# Patient Record
Sex: Female | Born: 1960 | Race: White | Hispanic: No | Marital: Married | Smoking: Never smoker
Health system: Southern US, Community
[De-identification: ages and names within clinical notes are randomized; demographics above are authoritative.]

## PROBLEM LIST (undated history)

## (undated) DIAGNOSIS — F419 Anxiety disorder, unspecified: Secondary | ICD-10-CM

## (undated) DIAGNOSIS — F32A Depression, unspecified: Secondary | ICD-10-CM

## (undated) DIAGNOSIS — H4902 Third [oculomotor] nerve palsy, left eye: Principal | ICD-10-CM

## (undated) DIAGNOSIS — G2581 Restless legs syndrome: Secondary | ICD-10-CM

## (undated) DIAGNOSIS — E039 Hypothyroidism, unspecified: Secondary | ICD-10-CM

## (undated) DIAGNOSIS — F329 Major depressive disorder, single episode, unspecified: Secondary | ICD-10-CM

## (undated) DIAGNOSIS — E119 Type 2 diabetes mellitus without complications: Secondary | ICD-10-CM

## (undated) DIAGNOSIS — E7800 Pure hypercholesterolemia, unspecified: Secondary | ICD-10-CM

## (undated) DIAGNOSIS — D279 Benign neoplasm of unspecified ovary: Secondary | ICD-10-CM

## (undated) HISTORY — DX: Third (oculomotor) nerve palsy, left eye: H49.02

## (undated) HISTORY — DX: Depression, unspecified: F32.A

## (undated) HISTORY — PX: OVARIAN CYST SURGERY: SHX726

## (undated) HISTORY — PX: KNEE ARTHROSCOPY: SUR90

## (undated) HISTORY — DX: Major depressive disorder, single episode, unspecified: F32.9

## (undated) HISTORY — DX: Benign neoplasm of unspecified ovary: D27.9

## (undated) HISTORY — DX: Hypothyroidism, unspecified: E03.9

## (undated) HISTORY — DX: Anxiety disorder, unspecified: F41.9

## (undated) HISTORY — DX: Pure hypercholesterolemia, unspecified: E78.00

## (undated) HISTORY — PX: SALPINGOOPHORECTOMY: SHX82

## (undated) HISTORY — DX: Restless legs syndrome: G25.81

---

## 2016-05-06 DIAGNOSIS — K529 Noninfective gastroenteritis and colitis, unspecified: Secondary | ICD-10-CM | POA: Diagnosis not present

## 2016-05-06 DIAGNOSIS — R112 Nausea with vomiting, unspecified: Secondary | ICD-10-CM | POA: Diagnosis not present

## 2017-02-28 DIAGNOSIS — E039 Hypothyroidism, unspecified: Secondary | ICD-10-CM | POA: Diagnosis not present

## 2017-02-28 DIAGNOSIS — R51 Headache: Secondary | ICD-10-CM | POA: Diagnosis not present

## 2017-02-28 DIAGNOSIS — Z8669 Personal history of other diseases of the nervous system and sense organs: Secondary | ICD-10-CM | POA: Diagnosis not present

## 2017-02-28 DIAGNOSIS — H53142 Visual discomfort, left eye: Secondary | ICD-10-CM | POA: Diagnosis not present

## 2017-02-28 DIAGNOSIS — E785 Hyperlipidemia, unspecified: Secondary | ICD-10-CM | POA: Diagnosis not present

## 2017-02-28 DIAGNOSIS — E119 Type 2 diabetes mellitus without complications: Secondary | ICD-10-CM | POA: Diagnosis not present

## 2017-02-28 DIAGNOSIS — H4902 Third [oculomotor] nerve palsy, left eye: Secondary | ICD-10-CM | POA: Diagnosis not present

## 2017-02-28 DIAGNOSIS — R110 Nausea: Secondary | ICD-10-CM | POA: Diagnosis not present

## 2017-02-28 DIAGNOSIS — F329 Major depressive disorder, single episode, unspecified: Secondary | ICD-10-CM | POA: Diagnosis not present

## 2017-03-01 DIAGNOSIS — R51 Headache: Secondary | ICD-10-CM | POA: Diagnosis not present

## 2017-03-01 DIAGNOSIS — H53142 Visual discomfort, left eye: Secondary | ICD-10-CM | POA: Diagnosis not present

## 2017-03-01 DIAGNOSIS — H4902 Third [oculomotor] nerve palsy, left eye: Secondary | ICD-10-CM | POA: Diagnosis not present

## 2017-03-01 DIAGNOSIS — R110 Nausea: Secondary | ICD-10-CM | POA: Diagnosis not present

## 2017-03-06 ENCOUNTER — Emergency Department (HOSPITAL_COMMUNITY)
Admission: EM | Admit: 2017-03-06 | Discharge: 2017-03-06 | Disposition: A | Discharge status: Home / Self Care | Payer: BC Managed Care – PPO | Attending: Emergency Medicine | Admitting: Emergency Medicine

## 2017-03-06 ENCOUNTER — Emergency Department (HOSPITAL_COMMUNITY): Payer: BC Managed Care – PPO

## 2017-03-06 ENCOUNTER — Encounter (HOSPITAL_COMMUNITY): Payer: Self-pay

## 2017-03-06 DIAGNOSIS — Z794 Long term (current) use of insulin: Secondary | ICD-10-CM | POA: Diagnosis not present

## 2017-03-06 DIAGNOSIS — R51 Headache: Secondary | ICD-10-CM | POA: Insufficient documentation

## 2017-03-06 DIAGNOSIS — E119 Type 2 diabetes mellitus without complications: Secondary | ICD-10-CM | POA: Insufficient documentation

## 2017-03-06 DIAGNOSIS — H4902 Third [oculomotor] nerve palsy, left eye: Secondary | ICD-10-CM

## 2017-03-06 DIAGNOSIS — R112 Nausea with vomiting, unspecified: Secondary | ICD-10-CM | POA: Diagnosis present

## 2017-03-06 HISTORY — DX: Type 2 diabetes mellitus without complications: E11.9

## 2017-03-06 LAB — COMPREHENSIVE METABOLIC PANEL
ALBUMIN: 4.3 g/dL (ref 3.5–5.0)
ALT: 25 U/L (ref 14–54)
AST: 24 U/L (ref 15–41)
Alkaline Phosphatase: 57 U/L (ref 38–126)
Anion gap: 13 (ref 5–15)
BUN: 15 mg/dL (ref 6–20)
CHLORIDE: 100 mmol/L — AB (ref 101–111)
CO2: 26 mmol/L (ref 22–32)
Calcium: 9.5 mg/dL (ref 8.9–10.3)
Creatinine, Ser: 0.88 mg/dL (ref 0.44–1.00)
GFR calc Af Amer: >60 mL/min (ref >60)
GFR calc non Af Amer: >60 mL/min (ref >60)
Glucose, Bld: 169 mg/dL — ABNORMAL HIGH (ref 65–99)
POTASSIUM: 3.3 mmol/L — AB (ref 3.5–5.1)
SODIUM: 139 mmol/L (ref 135–145)
Total Bilirubin: 0.5 mg/dL (ref 0.3–1.2)
Total Protein: 7.1 g/dL (ref 6.5–8.1)

## 2017-03-06 LAB — CBC WITH DIFFERENTIAL/PLATELET
BASOS ABS: 0 10*3/uL (ref 0.0–0.1)
BASOS PCT: 0 %
EOS ABS: 0.1 10*3/uL (ref 0.0–0.7)
EOS PCT: 1 %
HCT: 41.9 % (ref 36.0–46.0)
Hemoglobin: 13.9 g/dL (ref 12.0–15.0)
Lymphocytes Relative: 29 %
Lymphs Abs: 2.4 10*3/uL (ref 0.7–4.0)
MCH: 30 pg (ref 26.0–34.0)
MCHC: 33.2 g/dL (ref 30.0–36.0)
MCV: 90.5 fL (ref 78.0–100.0)
MONO ABS: 0.4 10*3/uL (ref 0.1–1.0)
Monocytes Relative: 5 %
Neutro Abs: 5.4 10*3/uL (ref 1.7–7.7)
Neutrophils Relative %: 65 %
PLATELETS: 232 10*3/uL (ref 150–400)
RBC: 4.63 MIL/uL (ref 3.87–5.11)
RDW: 13 % (ref 11.5–15.5)
WBC: 8.3 10*3/uL (ref 4.0–10.5)

## 2017-03-06 LAB — CBG MONITORING, ED: GLUCOSE-CAPILLARY: 159 mg/dL — AB (ref 65–99)

## 2017-03-06 MED ORDER — SODIUM CHLORIDE 0.9 % IV BOLUS (SEPSIS)
1000.0000 mL | Freq: Once | INTRAVENOUS | Status: AC
  Administered 2017-03-06: 1000 mL via INTRAVENOUS

## 2017-03-06 MED ORDER — PROCHLORPERAZINE EDISYLATE 5 MG/ML IJ SOLN
10.0000 mg | Freq: Once | INTRAMUSCULAR | Status: AC
  Administered 2017-03-06: 10 mg via INTRAVENOUS
  Filled 2017-03-06: qty 2

## 2017-03-06 MED ORDER — ONDANSETRON HCL 4 MG/2ML IJ SOLN
4.0000 mg | Freq: Once | INTRAMUSCULAR | Status: AC
  Administered 2017-03-06: 4 mg via INTRAVENOUS
  Filled 2017-03-06: qty 2

## 2017-03-06 MED ORDER — GADOBENATE DIMEGLUMINE 529 MG/ML IV SOLN
14.0000 mL | Freq: Once | INTRAVENOUS | Status: AC | PRN
  Administered 2017-03-06: 14 mL via INTRAVENOUS

## 2017-03-06 MED ORDER — ONDANSETRON 4 MG PO TBDP
ORAL_TABLET | ORAL | Status: AC
  Filled 2017-03-06: qty 1

## 2017-03-06 MED ORDER — DIAZEPAM 5 MG/ML IJ SOLN
5.0000 mg | Freq: Once | INTRAMUSCULAR | Status: AC
  Administered 2017-03-06: 5 mg via INTRAVENOUS
  Filled 2017-03-06: qty 2

## 2017-03-06 MED ORDER — ONDANSETRON 4 MG PO TBDP
4.0000 mg | ORAL_TABLET | Freq: Once | ORAL | Status: AC
  Administered 2017-03-06: 4 mg via ORAL

## 2017-03-06 MED ORDER — HYDROMORPHONE HCL 1 MG/ML IJ SOLN
1.0000 mg | Freq: Once | INTRAMUSCULAR | Status: AC
  Administered 2017-03-06: 1 mg via INTRAVENOUS
  Filled 2017-03-06: qty 1

## 2017-03-06 NOTE — ED Notes (Signed)
Pt still in MRI 

## 2017-03-06 NOTE — ED Notes (Signed)
Consulted with main lab. Per main lab, burgdorfri antibody test to be drawn in gold top tube.

## 2017-03-06 NOTE — ED Notes (Signed)
Pt still in MRI at this time, per MRI pt's scan will be finished in approx 45 mins. This nurse updated family, pt husband given another sprite and graham crackers.

## 2017-03-06 NOTE — ED Provider Notes (Signed)
[facility] DEPT Provider Note   CSN: [id number] Arrival date & time: 03/06/17  1012     History   Chief Complaint Chief Complaint  Patient presents with  . Emesis  . Headache    HPI Heather Guerrero is a 56 y.o. female.  Patient complains of headache and nausea. This been going on for 10 days she was seen at [facility] and was evaluated and told if she got worse to come to this hospital where there is neurologist. She was diagnosed with a third nerve palsy.   The history is provided by the patient. No language interpreter was used.  Emesis   This is a recurrent problem. The problem occurs 2 to 4 times per day. The problem has not changed since onset.The emesis has an appearance of stomach contents. There has been no fever. Associated symptoms include headaches. Pertinent negatives include no abdominal pain, no chills, no cough and no diarrhea.  Headache   This is a recurrent problem. The current episode started more than 1 week ago. The problem occurs constantly. The problem has not changed since onset.The headache is associated with nothing. The pain is located in the left unilateral region. The quality of the pain is described as dull. The pain is at a severity of 6/10. Associated symptoms include vomiting.    Past Medical History:  Diagnosis Date  . Diabetes mellitus without complication ([place])     There are no active problems to display for this patient.   Past Surgical History:  Procedure Laterality Date  . KNEE ARTHROSCOPY     right    OB History    No data available       Home Medications    Prior to Admission medications   Not on File    Family History No family history on file.  Social History Social History  Substance Use Topics  . Smoking status: Never Smoker  . Smokeless tobacco: Never Used  . Alcohol use No     Allergies   Sulfa antibiotics   Review of Systems Review of Systems  Constitutional: Negative for appetite  change, chills and fatigue.  HENT: Negative for congestion, ear discharge and sinus pressure.        Patient seems to have inability to move her left thigh to the right  Eyes: Negative for discharge.  Respiratory: Negative for cough.   Cardiovascular: Negative for chest pain.  Gastrointestinal: Positive for vomiting. Negative for abdominal pain and diarrhea.  Genitourinary: Negative for frequency and hematuria.  Musculoskeletal: Negative for back pain.  Skin: Negative for rash.  Neurological: Positive for headaches. Negative for seizures.  Psychiatric/Behavioral: Negative for hallucinations.     Physical Exam Updated Vital Signs BP 121/81   Pulse 85   Temp 98.2 F (36.8 C) (Oral)   Resp 16   Ht 5\' 3"  (1.6 m)   Wt 65.3 kg (144 lb)   SpO2 100%   BMI 25.51 kg/m   Physical Exam  Constitutional: She is oriented to person, place, and time. She appears well-developed.  HENT:  Head: Normocephalic.  Patient had a palsy in her left eye moving it to the right.  Eyes: Conjunctivae and EOM are normal. No scleral icterus.  Neck: Neck supple. No thyromegaly present.  Cardiovascular: Normal rate and regular rhythm.  Exam reveals no gallop and no friction rub.   No murmur heard. Pulmonary/Chest: No stridor. She has no wheezes. She has no rales. She exhibits no tenderness.  Abdominal: She exhibits no  distension. There is no tenderness. There is no rebound.  Musculoskeletal: Normal range of motion. She exhibits no edema.  Lymphadenopathy:    She has no cervical adenopathy.  Neurological: She is oriented to person, place, and time. She exhibits normal muscle tone. Coordination normal.  Skin: No rash noted. No erythema.  Psychiatric: She has a normal mood and affect. Her behavior is normal.     ED Treatments / Results  Labs (all labs ordered are listed, but only abnormal results are displayed) Labs Reviewed  COMPREHENSIVE METABOLIC PANEL - Abnormal; Notable for the following:        Result Value   Potassium 3.3 (*)    Chloride 100 (*)    Glucose, Bld 169 (*)    All other components within normal limits  CBG MONITORING, ED - Abnormal; Notable for the following:    Glucose-Capillary 159 (*)    All other components within normal limits  CBC WITH DIFFERENTIAL/PLATELET  CBG MONITORING, ED    EKG  EKG Interpretation None       Radiology No results found.  Procedures Procedures (including critical care time)  Medications Ordered in ED Medications  ondansetron (ZOFRAN-ODT) disintegrating tablet 4 mg (4 mg Oral Given 03/06/17 1031)  sodium chloride 0.9 % bolus 1,000 mL (0 mLs Intravenous Stopped 03/06/17 1508)  HYDROmorphone (DILAUDID) injection 1 mg (1 mg Intravenous Given 03/06/17 1317)  ondansetron (ZOFRAN) injection 4 mg (4 mg Intravenous Given 03/06/17 1317)  ondansetron (ZOFRAN) injection 4 mg (4 mg Intravenous Given 03/06/17 1515)  prochlorperazine (COMPAZINE) injection 10 mg (10 mg Intravenous Given 03/06/17 1541)     Initial Impression / Assessment and Plan / ED Course  I have reviewed the triage vital signs and the nursing notes.  Pertinent labs & imaging results that were available during my care of the patient were reviewed by me and considered in my medical decision making (see chart for details).     Patient with what appears to be paralysis in her left eye. She is being seen by neurology  Final Clinical Impressions(s) / ED Diagnoses   Final diagnoses:  Third nerve palsy of left eye    New Prescriptions New Prescriptions   No medications on file     Milton Ferguson, MD 03/06/17 [phone]

## 2017-03-06 NOTE — ED Notes (Signed)
Pt was still experiencing nausea after zofran administration, given 10 mg ordered compazine by this nurse in MRI.

## 2017-03-06 NOTE — ED Triage Notes (Signed)
Pt arrives with complaints of pressure behind left eye, headache, and vomiting. Pt was dx with trigeminal neuralgia and has been taking medications prescribed (fioricet and prednisone) with no relief. This morning pt began vomiting with pain. Pt has DM with insulin pump on at this time. Pt reports having multiple ct scans and an mri Saturday for same problem.

## 2017-03-06 NOTE — ED Notes (Signed)
Pt CBG was 159, notified Chelsea(RN)

## 2017-03-06 NOTE — Consult Note (Signed)
NEURO HOSPITALIST CONSULT NOTE   Requesting physician: Dr. Roderic Palau  Reason for Consult: Headache  History obtained from:  Patient and Chart    HPI:                                                                                                                                          Heather Guerrero is an 56 y.o. female who presents to the [facility] Emergency department this morning with a 10 day history of sharp temporal pain, a left-frontal headache, inability to adduct the left eye and vomiting.  She presented to [facility] five days ago with this problem where she was diagnosed with a third nerve palsy. MRI performed there was unremarkable; she was prescribed Fioricet and prednisone and sent to an opthalmologist for follow up. Heather Guerrero stated that there has been no change in the quality of her headache or ability to move her eye since she woke up on [id number].  On arrival to [facility], she stated her headache was a 9/10; following zofran, compazine and dilauded, she stated it was 2/10 until the lights were turned on for the neuro exam. On our visit, she was laying in bed, visibly uncomfortable with her left eye shut unless requested to open it. She notes that she has had blurry vision since [id number], and keeping the left eye closed occasionally helps to relieve this.  [name] has a history of type one diabetes (diagnosed at age 60) and carries a diagnosis of trigeminal neuralgia (clinically unlikely). She is otherwise healthy.  Husband present at bedside  Past Medical History:  Diagnosis Date  . Diabetes mellitus without complication [facility])     Past Surgical History:  Procedure Laterality Date  . KNEE ARTHROSCOPY     right    No family history on file.  Social History:  reports that she has never smoked. She has never used smokeless tobacco. She reports that she does not drink alcohol or use drugs.  Allergies  Allergen Reactions  . Sulfa Antibiotics      MEDICATIONS:                                                                                                                   No outpatient prescriptions have been marked as taking for  the 03/06/17 encounter [facility] Encounter).     Review Of Systems:                                                                                                           History obtained from the patient  General: Positive fatigue - prednisone causes her insomnia Psychological: Negative for any known behavioral disorder Ophthalmic: As per HPI ENT: Negative for abrupt loss of hearing or vertigo Respiratory: Negative for cough, hemoptysis, shortness of breath or wheezing Cardiovascular: Negative for chest pain, dyspnea on exertion, edema or irregular heartbeat Gastrointestinal: Positive for nausea/vomiting Genito-Urinary: Negative for dysuria, hematuria, incontinence Musculoskeletal: Negative for joint swelling or muscular weakness Neurological: As noted in HPI Dermatological: Negative for rash or skin changes  Blood pressure 121/81, pulse 85, temperature 98.2 F (36.8 C), temperature source Oral, resp. rate 16, height 5\' 3"  (1.6 m), weight 65.3 kg (144 lb), SpO2 100 %.   Physical Examination:                                                                                                      General: WDWN female. As per HPI. HEENT:  Normocephalic, no lesions, without obvious abnormality.  Normal external eye and conjunctiva.  Normal external ears. Normal external nose, mucus membranes and septum.  Normal pharynx. Cardiovascular: S1, S2 normal, pulses palpable throughout   Pulmonary: chest clear, no wheezing, rales, normal symmetric air entry Abdomen: Soft, non-tender Extremities: no joint deformities, effusion, or inflammation Musculoskeletal: no joint tenderness, deformity or swelling Tone and bulk:normal tone throughout; no atrophy noted Skin: warm and dry, no hyperpigmentation, vitiligo, or  suspicious lesions  Neurological Examination:                                                                                               Mental Status: Heather Guerrero is alert, oriented x 4, thought content appropriate.  Speech fluent without evidence of aphasia. Able to follow 3-step commands without difficulty. Cranial Nerves: II: Visual fields grossly normal, pupils are reactive to light bilaterally, with subtle asymmetry, left being approximately 0.5 mm larger than right. III,IV, VI: Ptosis not present, extra-ocular muscle movements fully intact on the right. Left eye with inability to adduct and moderate weakness with decreased excursion  on both upward and downward gaze. No nystagmus noted. V,VII: Smile and eyebrow raise is symmetric. Facial light touch sensation intact bilaterally VIII: Hearing grossly intact IX,X: Uvula and palate rise symmetrically XI: SCM and bilateral shoulder shrug strength 5/5 XII: Midline tongue extension Motor: Right :     Upper extremity   5/5   Left:     Upper extremity   5/5          Lower extremity   5/5     Lower extremity   5/5 Pronator drift not present Sensory: Light touch intact throughout, bilaterally Deep Tendon Reflexes: 2+ and symmetric throughout Plantars: Right: downgoing   Left: downgoing Cerebellar: Finger-to-nose test without evidence of dysmetria or ataxia.  Gait: Normal gait and station   Lab Results: Basic Metabolic Panel:  Recent Labs Lab 03/06/17 1037  NA 139  K 3.3*  CL 100*  CO2 26  GLUCOSE 169*  BUN 15  CREATININE 0.88  CALCIUM 9.5    Liver Function Tests:  Recent Labs Lab 03/06/17 1037  AST 24  ALT 25  ALKPHOS 57  BILITOT 0.5  PROT 7.1  ALBUMIN 4.3   No results for input(s): LIPASE, AMYLASE in the last 168 hours. No results for input(s): AMMONIA in the last 168 hours.  CBC:  Recent Labs Lab 03/06/17 1037  WBC 8.3  NEUTROABS 5.4  HGB 13.9  HCT 41.9  MCV 90.5  PLT 232    Cardiac Enzymes: No  results for input(s): CKTOTAL, CKMB, CKMBINDEX, TROPONINI in the last 168 hours.  Lipid Panel: No results for input(s): CHOL, TRIG, HDL, CHOLHDL, VLDL, LDLCALC in the last 168 hours.  CBG:  Recent Labs Lab 03/06/17 [place]    Microbiology: No results found for this or any previous visit.  Coagulation Studies: No results for input(s): LABPROT, INR in the last 72 hours.  Imaging: No results found.  History and examination documented by Solon Augusta PA-C, [facility] 03/06/2017, 4:30 PM  Assessment and Plan: 56 year old female with left CN III palsy 1. 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 Also possible would be nerve root inflammation or infectious etiology such as Lyme disease. Compression of CN III by extrinsic mass or aneurysm unlikely given relative sparing of the pupil. Not consistent with INO given vertical gaze deficit. Prior MRI at OSH negative for lesion, but was performed without contrast.  2. Type I diabetes. Diagnosed at age 22. Has insulin pump.   Recommendations: 1. Serum Lyme titer 2. MRI brain with and without contrast, with thin slices through brainstem 3. MRI orbits with and without contrast.  4. MRA head to rule out aneurysm.   Electronically signed: Dr. Kerney Elbe

## 2017-03-06 NOTE — ED Notes (Signed)
Per MRI, pt states she is "tense." Will speak with Dr. Wilson Singer.

## 2017-03-06 NOTE — ED Notes (Signed)
Pt transported to MRI 

## 2017-03-06 NOTE — ED Notes (Signed)
Pt sill in MRI, this nurse introduced self to pt family. Pt husband brought sprite and ice water.

## 2017-03-10 LAB — B. BURGDORFI ANTIBODIES: B burgdorferi Ab IgG+IgM: <0.91 {ISR} (ref 0.00–0.90)

## 2017-03-11 ENCOUNTER — Encounter: Payer: Self-pay | Admitting: Neurology

## 2017-03-11 ENCOUNTER — Ambulatory Visit (INDEPENDENT_AMBULATORY_CARE_PROVIDER_SITE_OTHER): Payer: BC Managed Care – PPO | Admitting: Neurology

## 2017-03-11 DIAGNOSIS — H4902 Third [oculomotor] nerve palsy, left eye: Secondary | ICD-10-CM | POA: Diagnosis not present

## 2017-03-11 HISTORY — DX: Third (oculomotor) nerve palsy, left eye: H49.02

## 2017-03-11 MED ORDER — OXYCODONE HCL 10 MG PO TABS: 10.0000 mg | ORAL_TABLET | Freq: Four times a day (QID) | ORAL | 0 refills | Status: DC | PRN

## 2017-03-11 MED ORDER — GABAPENTIN 300 MG PO CAPS: 300.0000 mg | ORAL_CAPSULE | Freq: Three times a day (TID) | ORAL | 3 refills | Status: DC

## 2017-03-11 NOTE — Patient Instructions (Signed)
   We will start gabapentin for the headache.  Neurontin (gabapentin) may result in drowsiness, ankle swelling, gait instability, or possibly dizziness. Please contact our office if significant side effects occur with this medication.  

## 2017-03-11 NOTE — Progress Notes (Signed)
Reason for visit: Double vision  Referring physician: Dr. Angus Palms Heather Guerrero is a 56 y.o. female  History of present illness:  [name] is a 56 year old left-handed white female with a history of diabetes with a hemoglobin A1c of 7.6. The patient began noting onset of some left temporal discomfort on 02/25/2020. By May 25, she had overt double vision with severe pain on the left temporal area and into the orbit as well. The patient went to [facility], she underwent a CT of the brain that was unremarkable, CT angiogram was unremarkable and MRI of the brain was done that was normal. The patient had blood work that was done including a B12 level, sedimentation rate, C-reactive protein, TSH, and RPR that were normal. The patient was discharged to home with the diagnosis of a diabetic third nerve palsy and she was given medications for pain. The patient went to [facility] on 03/06/2017 because of ongoing pain. A MRI of the brain was repeated which was normal, MRA of the head and neck was done and was unremarkable with exception of a questionable 2 mm outpouching of the takeoff of the right posterior cerebral artery. It was not definite that this represented an aneurysm. The MRV study was normal. The patient has had ongoing pain which has been her main disability. She continues to see double, she has not had ptosis of the left eye. She reports no numbness or weakness of the face, arms, or legs. She denies any balance changes or difficulty controlling the bowels or the bladder. She has not had any change in speech or swallowing. The patient has been out of work because of the above problem.  Past Medical History:  Diagnosis Date  . Depression   . Diabetes mellitus without complication ([place])   . Hypercholesteremia     Past Surgical History:  Procedure Laterality Date  . KNEE ARTHROSCOPY     right  . OVARIAN CYST SURGERY    . SALPINGOOPHORECTOMY      History reviewed. No  pertinent family history.  Social history:  reports that she has never smoked. She has never used smokeless tobacco. She reports that she does not drink alcohol or use drugs.  Medications:  Prior to Admission medications   Medication Sig Start Date End Date Taking? Authorizing Provider  atorvastatin (LIPITOR) 10 MG tablet Take 10 mg by mouth daily.   Yes [provider]  butalbital-acetaminophen-caffeine (FIORICET, ESGIC) 50-325-40 MG tablet Take 1 tablet by mouth every 4 (four) hours as needed. 03/01/17  Yes [provider]  cetirizine (ZYRTEC) 10 MG tablet Take 10 mg by mouth daily.   Yes [provider]  Cholecalciferol (VITAMIN D3) 1000 units CAPS Take 1,000 Units by mouth daily.   Yes [provider]  insulin aspart (NOVOLOG) 100 UNIT/ML injection Inject into the skin 3 (three) times daily before meals. Pump   Yes [provider]  levothyroxine (SYNTHROID, LEVOTHROID) 50 MCG tablet Take 50 mcg by mouth daily before breakfast.   Yes [provider]  lisinopril (PRINIVIL,ZESTRIL) 2.5 MG tablet Take 2.5 mg by mouth daily.   Yes [provider]  Multiple Vitamin (MULTIVITAMIN) tablet Take 1 tablet by mouth daily.   Yes [provider]  Oxycodone HCl 10 MG TABS Take 10 mg by mouth every 6 (six) hours as needed. 03/10/17  Yes [provider]  predniSONE (DELTASONE) 10 MG tablet Take 10 mg by mouth daily with breakfast. taper   Yes  [provider]  sertraline (ZOLOFT) 100 MG tablet Take 100 mg by mouth daily.   Yes [provider]      Allergies  Allergen Reactions  . Sulfa Antibiotics     ROS:  Out of a complete 14 system review of symptoms, the patient complains only of the following symptoms, and all other reviewed systems are negative.  Fatigue Ringing in the ears Double vision, eye pain Allergies Headache, dizziness Decreased energy, change in appetite Restless legs  Blood pressure  120/78, pulse 88, height 5\' 3"  (1.6 m), weight 141 lb (64 kg).  Physical Exam  General: The patient is alert and cooperative at the time of the examination.  Eyes: Pupils are equal, round, and reactive to light. Discs are flat bilaterally.  Neck: The neck is supple, no carotid bruits are noted.  Respiratory: The respiratory examination is clear.  Cardiovascular: The cardiovascular examination reveals a regular rate and rhythm, no obvious murmurs or rubs are noted.  Skin: Extremities are without significant edema.  Neurologic Exam  Mental status: The patient is alert and oriented x 3 at the time of the examination. The patient has apparent normal recent and remote memory, with an apparently normal attention span and concentration ability.  Cranial nerves: Facial symmetry is present. There is good sensation of the face to pinprick and soft touch bilaterally. The strength of the facial muscles and the muscles to head turning and shoulder shrug are normal bilaterally. Speech is well enunciated, no aphasia or dysarthria is noted. Extraocular movements are full with the right eye, on the left eye, the patient is able to abduct the eye, she has incomplete superior deviation and medial deviation of the eye.Nicki [place] fields are full. The tongue is midline, and the patient has symmetric elevation of the soft palate. No obvious hearing deficits are noted.  Motor: The motor testing reveals 5 over 5 strength of all 4 extremities. Good symmetric motor tone is noted throughout.  Sensory: Sensory testing is intact to pinprick, soft touch, vibration sensation, and position sense on all 4 extremities. No evidence of extinction is noted.  Coordination: Cerebellar testing reveals good finger-nose-finger and heel-to-shin bilaterally.  Gait and station: Gait is normal. Tandem gait is slightly unsteady. Romberg is negative. No drift is seen.  Reflexes: Deep tendon reflexes are symmetric and normal bilaterally.  Toes are downgoing bilaterally.   Assessment/Plan:  1. Diabetic third nerve palsy, left  The patient is having significant pain in the left temporal area associated with her diabetic third nerve palsy. The pain should dissipate over the next couple weeks. The patient will be given gabapentin taking 300 mg 3 times daily. A prescription for oxycodone was also given with the 10 mg tablets, 90 tablets were given with no refills. The patient will follow-up in 2 months. We will expect improvement of the double vision as time goes on.    Jill Alexanders MD 03/11/2017 8:00 AM  [facility] [address] [place], [place] [address]  Phone [phone] Fax [phone]

## 2017-06-12 ENCOUNTER — Ambulatory Visit: Payer: BC Managed Care – PPO | Admitting: Adult Health

## 2018-06-30 IMAGING — MR MR MRA NECK WO/W CM
11 of 20 series · 21 of 48 positions shown · IV contrast (14    mh)
Comparison: None.

CLINICAL DATA: Initial evaluation for acute headache, left-sided
third nerve palsy.

EXAM:
MRI HEAD WITHOUT AND WITH CONTRAST
MRA HEAD WITHOUT CONTRAST
MRA NECK WITHOUT AND WITH CONTRAST
MRV HEAD WITHOUT CONTRAST
TECHNIQUE: Multiplanar, multiecho pulse sequences of the brain and surrounding
structures were obtained without intravenous contrast. Angiographic
images of the Circle of Willis were obtained using MRA technique
without intravenous contrast. Angiographic images of the neck were
obtained using MRA technique without and with intravenous contrast.
Carotid stenosis measurements (when applicable) are obtained
utilizing NASCET criteria, using the distal internal carotid
diameter as the denominator.
CONTRAST:  14mL MULTIHANCE GADOBENATE DIMEGLUMINE 529 MG/ML IV SOLN

[Series 3: DWI · axial · 3.0mm · 1.09mm/px · z∈[-92,+54]mm · 2 of 100 slices shown (1 of 4)]
[im 1/100]
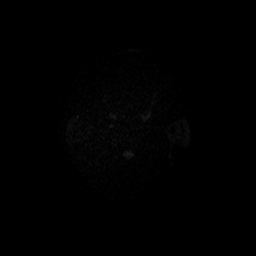
[im 100/100]
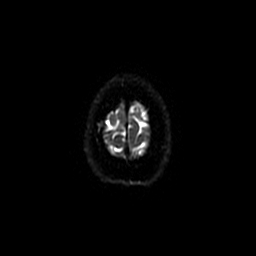

[Series 4: DWI · coronal · 3.0mm · 1.09mm/px · 2 of 110 slices shown (2 of 4)]
[im 1/110]
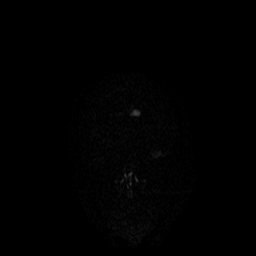
[im 110/110]
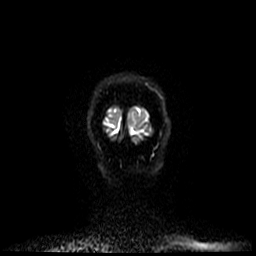

[Series 5: (id) mt fs · axial · 1.4mm · 0.43mm/px · z∈[-66,+28]mm · 3 of 136 slices shown]
[im 1/136]
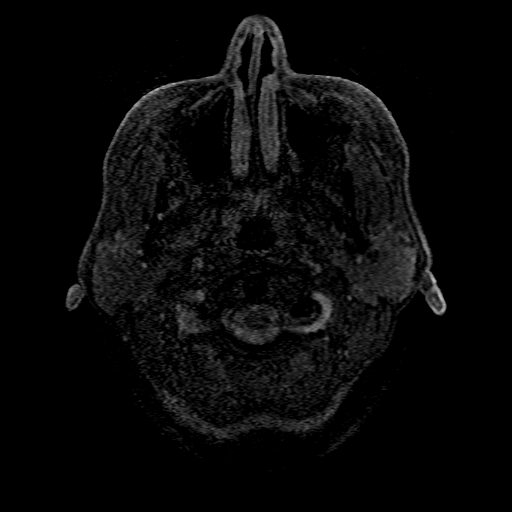
[im 68/136]
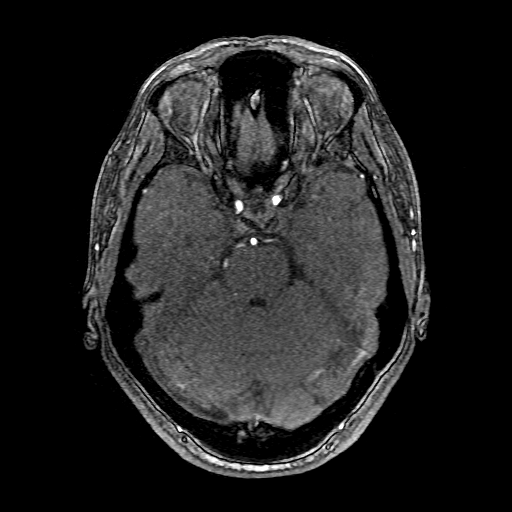
[im 136/136]
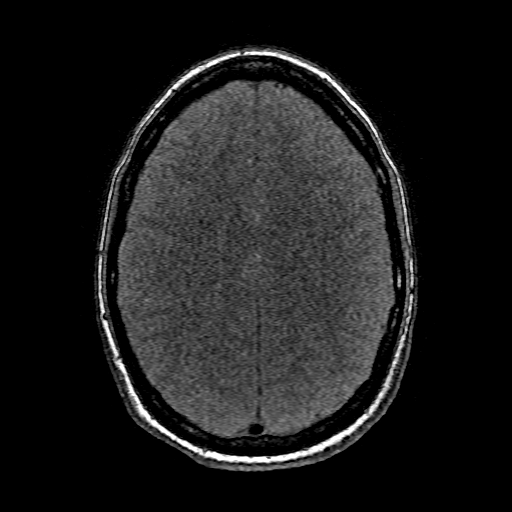

[Series 6: T2 · axial · 5.0mm · 0.47mm/px · 1 of 23 slices shown (1 of 2)]
[im 1/23]
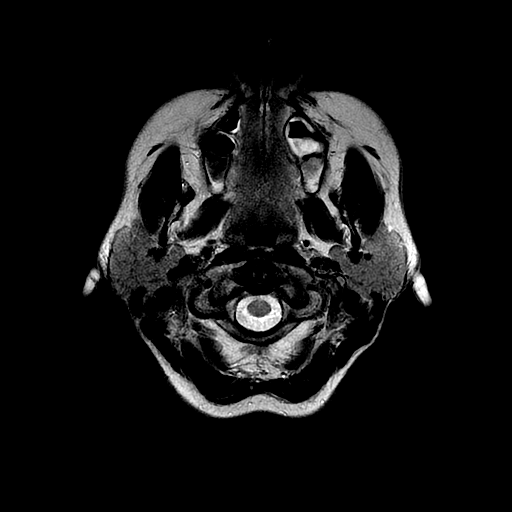

[Series 8: sag inhance(25 venc) · sagittal · 1.6mm · 0.47mm/px · 6 of 373 slices shown]
[im 1/373]
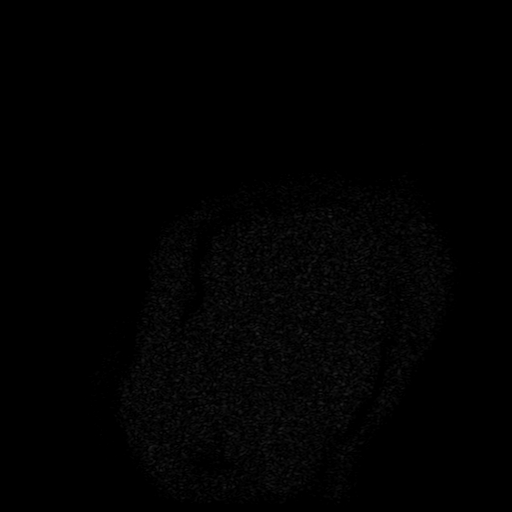
[im 47/373]
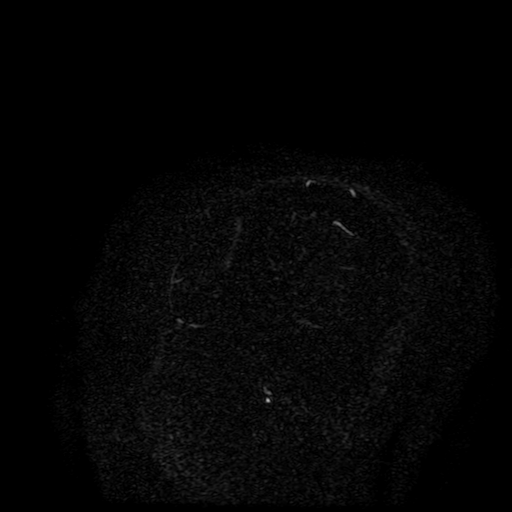
[im 94/373]
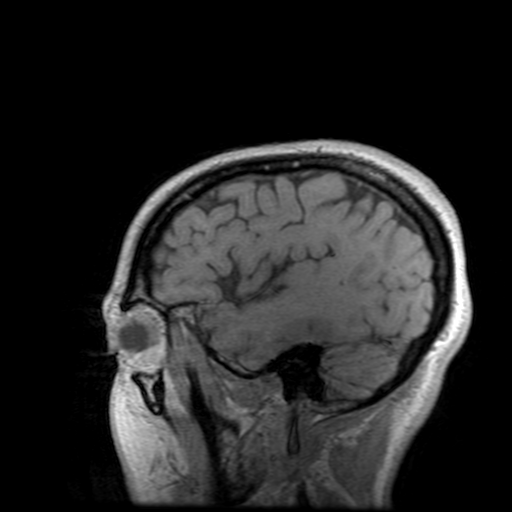
[im 140/373]
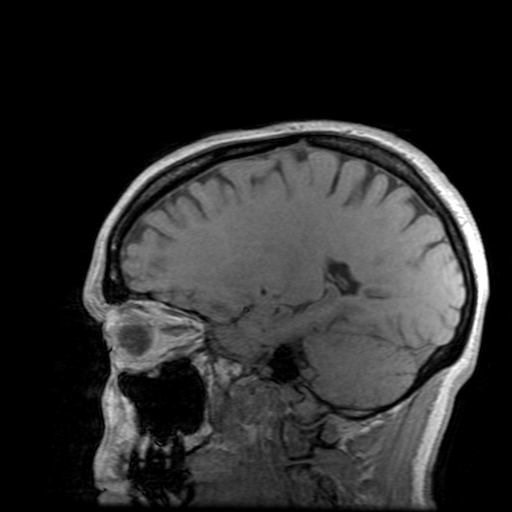
[im 233/373]
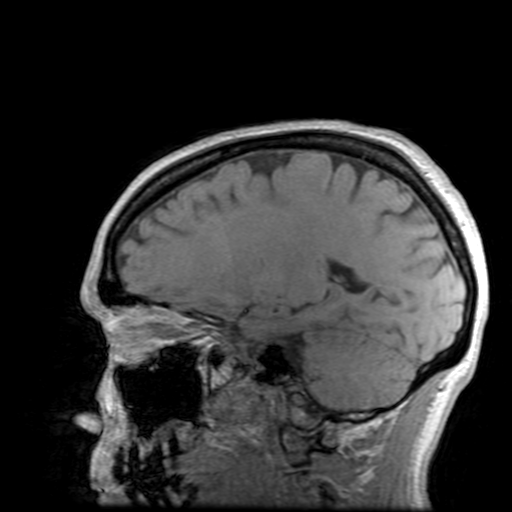
[im 280/373]
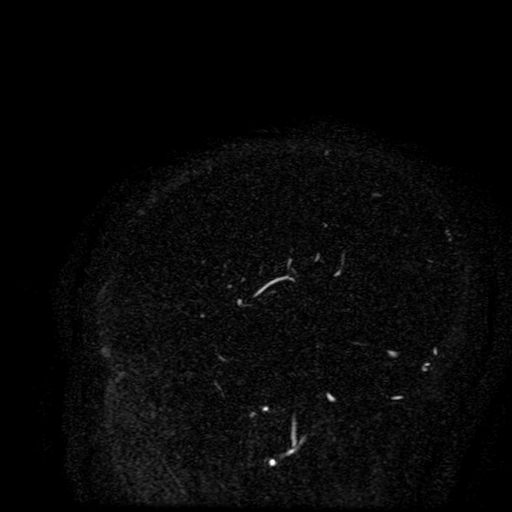

[Series 11: FLAIR · axial · 5.0mm · 0.47mm/px · 1 of 23 slices shown]
[im 1/23]
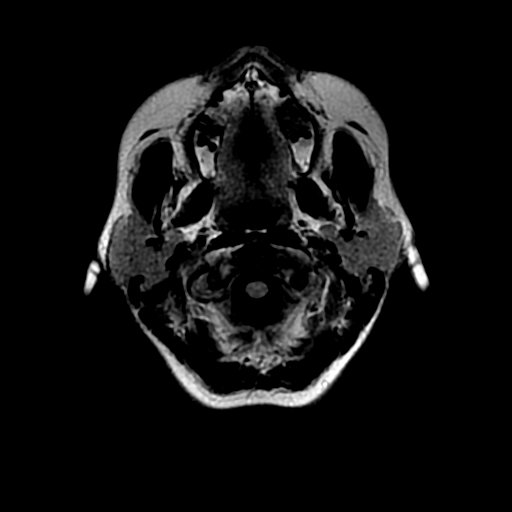

[Series 13: T2 · coronal · 5.0mm · 0.43mm/px · 1 of 28 slices shown (2 of 2)]
[im 1/28]
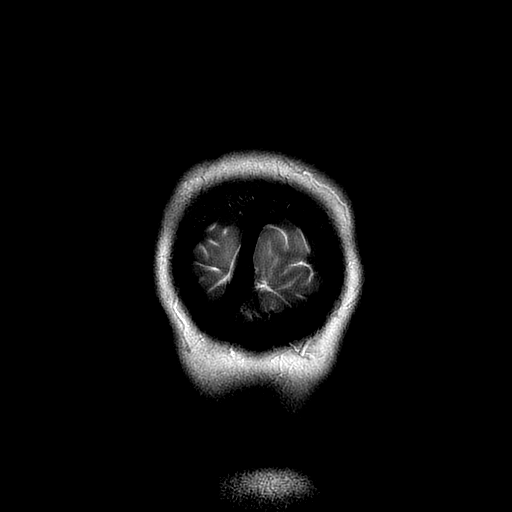

[Series 14: T1 · sagittal · 5.0mm · 0.47mm/px · 1 of 21 slices shown]
[im 1/21]
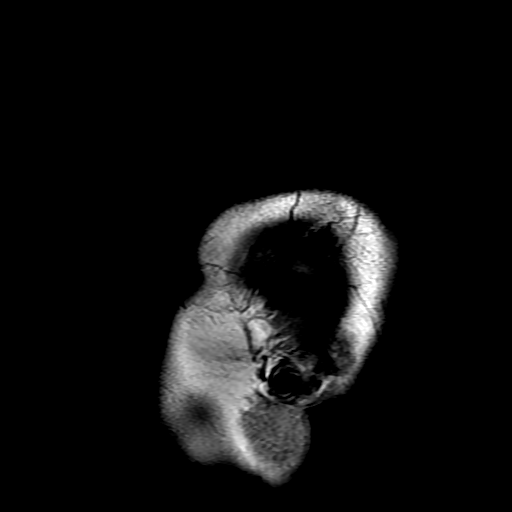

[Series 21: T1 post-contrast · coronal · 5.0mm · 0.43mm/px · 1 of 28 slices shown]
[im 1/28]
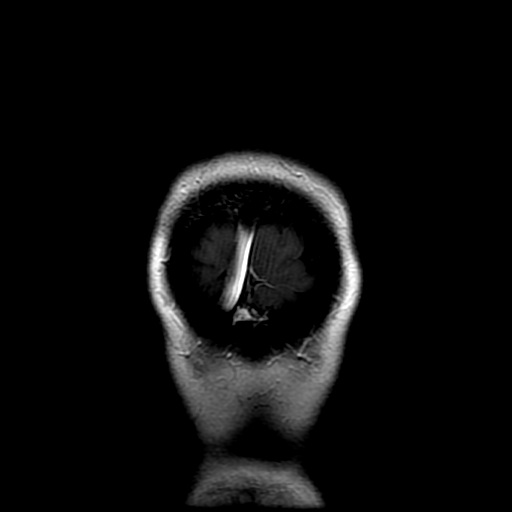

[Series 300: DWI · axial · 3.0mm · 1.09mm/px · 1 of 50 slices shown (3 of 4)]
[im 1/50]
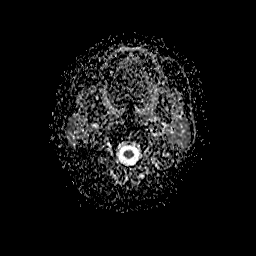

[Series 400: DWI · coronal · 3.0mm · 1.09mm/px · 2 of 55 slices shown (4 of 4)]
[im 1/55]
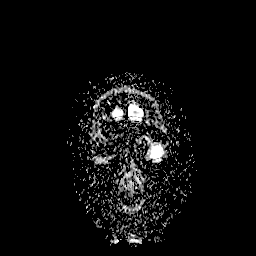
[im 55/55]
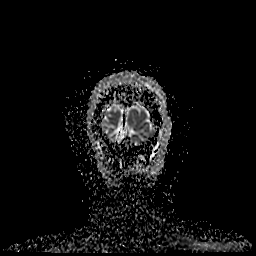

[21 of 48 positions shown; findings below may reference images not displayed]

FINDINGS: MRI HEAD FINDINGS

Cerebral volume within normal limits for age. No focal parenchymal
signal abnormality identified. No significant cerebral white matter
disease for age.

No abnormal foci of restricted diffusion to suggest acute or
subacute ischemia. Gray-white matter differentiation well
maintained. No encephalomalacia to suggest chronic infarction. No
evidence for acute or chronic intracranial hemorrhage.

No mass lesion, midline shift or mass effect. Ventricles normal size
without evidence for hydrocephalus. No extra-axial fluid collection.
Major dural sinuses are patent. No abnormal enhancement. Incidental
note made of a prominent DVA within the right temporal lobe at the
level the right periatrial white matter.

Pituitary gland suprasellar region within normal limits. Midline
structures intact and normal.

Major intracranial vascular flow voids are well maintained.

Craniocervical junction within normal limits. Visualized upper
cervical spine unremarkable. Bone marrow signal intensity mildly
heterogeneous but within normal limits. No scalp soft tissue
abnormality.

Globes and orbital soft tissues demonstrate no acute abnormality.

Mild mucosal thickening within the inferior maxillary sinuses.
Paranasal sinuses are otherwise clear. No mastoid effusion. Inner
ear structures normal.

MRA HEAD FINDINGS

ANTERIOR CIRCULATION:

Distal cervical segments of the internal carotid arteries are widely
patent with antegrade flow. Petrous, cavernous, and supraclinoid
segments widely patent without flow-limiting stenosis. ICA termini
widely patent. A1 segments patent. Anterior communicating artery
normal. Anterior cerebral arteries patent to their distal aspects.
M1 segments patent without stenosis or occlusion. MCA bifurcations
normal. No proximal M2 occlusion. Distal MCA branches well opacified
and symmetric.

POSTERIOR CIRCULATION:

Vertebral artery's code dominant and patent to the vertebrobasilar
junction without stenosis. Posterior inferior cerebral arteries
patent bilaterally. Basilar artery widely patent to its distal
aspect. Superior cerebral arteries patent bilaterally. Both of the
posterior cerebral artery is supplied via the basilar and are widely
patent to their distal aspects.

There is a tiny 2 mm focal outpouching extending posteriorly at the
takeoff of the right PCA (series 5, image 78). Finding not well seen
on mid reconstructions. Finding may reflect a small vascular
infundibulum versus tiny aneurysm. No other aneurysm or vascular
malformation.

MRA NECK FINDINGS

Visualized aortic arch of normal caliber with normal branch pattern.
No flow-limiting stenosis about the origin the great vessels.
Visualized subclavian arteries widely patent.

Right common and internal carotid arteries widely patent without
flow-limiting stenosis or occlusion. No significant atheromatous
narrowing about the right carotid bifurcation.

The left common and internal carotid arteries widely patent without
flow-limiting stenosis or occlusion. No significant atheromatous
narrowing about the left carotid bifurcation.

Both vertebral arteries arise from the subclavian arteries. Origin
of the left vertebral artery not visualized on this exam. Vertebral
artery is widely patent within the neck without stenosis or
occlusion.

MRV HEAD FINDINGS

Superior sagittal sinus widely patent without evidence for
thrombosis. Short fenestration noted at its mid aspect. Torcula
widely patent. Right transverse and sigmoid sinuses are dominant and
widely patent as is the proximal right internal jugular vein. Left
transverse and sigmoid sinuses are hypoplastic but patent as well
without evidence for thrombosis. Proximal left internal jugular vein
patent. Straight sinus, vein of Glock, and internal cerebral veins
widely patent.
IMPRESSION: MRI HEAD IMPRESSION:

Normal MRI of the brain. No acute intracranial abnormality
identified. No structural abnormality to explain patient's symptoms
identified.

MRA HEAD IMPRESSION:

1. Tiny 2 mm focal outpouching arising at the takeoff of the right
PCA, which may reflect a tiny aneurysm versus vascular infundibulum.
2. Otherwise normal intracranial MRA.
MRV HEAD IMPRESSION:

Normal MRV of the brain.

MRA NECK IMPRESSION:

Normal MRA of the neck.

## 2019-03-25 ENCOUNTER — Telehealth: Payer: Self-pay | Admitting: Neurology

## 2019-03-25 NOTE — Telephone Encounter (Signed)
Pt gave consent for VV on the phone/ Pt understands that although there may be some limitations with this type of visit, we will take all precautions to reduce any security or privacy concerns.  Pt understands that this will be treated like an in office visit and we will file with pt's insurance, and there may be a patient responsible charge related to this service.

## 2019-03-31 ENCOUNTER — Encounter: Payer: Self-pay | Admitting: Neurology

## 2019-03-31 NOTE — Addendum Note (Signed)
Addended by: Verlin Grills T on: 03/31/2019 01:57 PM   Modules accepted: Orders

## 2019-03-31 NOTE — Telephone Encounter (Signed)
I reached out to the pt and completed the pre charting for 04/01/19 my chart visit.

## 2019-04-01 ENCOUNTER — Encounter: Payer: Self-pay | Admitting: Neurology

## 2019-04-01 ENCOUNTER — Telehealth (INDEPENDENT_AMBULATORY_CARE_PROVIDER_SITE_OTHER): Payer: BC Managed Care – PPO | Admitting: Neurology

## 2019-04-01 DIAGNOSIS — G4489 Other headache syndrome: Secondary | ICD-10-CM | POA: Diagnosis not present

## 2019-04-01 DIAGNOSIS — H539 Unspecified visual disturbance: Secondary | ICD-10-CM | POA: Insufficient documentation

## 2019-04-01 MED ORDER — TOPIRAMATE 25 MG PO TABS: ORAL_TABLET | ORAL | 3 refills | Status: DC

## 2019-04-01 NOTE — Progress Notes (Signed)
Virtual Visit via Video Note  I connected with Heather Guerrero on 04/01/19 at 10:00 AM EDT by a video enabled telemedicine application and verified that I am speaking with the correct person using two identifiers.  Location: Patient: The patient is at home. Provider: Physician in office.   I discussed the limitations of evaluation and management by telemedicine and the availability of in person appointments. The patient expressed understanding and agreed to proceed.  History of Present Illness: Heather Guerrero is a 58 year old left-handed white female with a history of type 1 diabetes.  The patient does give a history of a few migraine type headaches when she was a teenager, she generally has not had much problems with headaches as an adult.  Beginning on 20 March 2019, the patient began having onset of episodes of visual changes with the sensation that everything is too bright, colors appear to be brighter than usual and almost seemed to go as if they are neon.  The visual changes may last 2 to 3 hours, but tend to occur on a daily basis at this point.  The patient also reports a pressure sensation in the head associated with some nausea and vomiting.  The patient has gone on gabapentin, taking 300 mg 3 times daily, this has helped some but the patient continues to have issues on a daily basis.  The patient reports no focal numbness or weakness of the face, arms, or legs.  She denies any double vision.  She reports no changes in balance or difficulty controlling the bowels and bladder, she reports no neck discomfort.  She has also noted a side to side head tremor that has been present off and on over the last several months, when she is cold the head tremor is more prominent.  The patient comes here for further evaluation.   Observations/Objective: The video evaluation reveals that the patient is alert and cooperative.  Speech is well enunciated, not aphasic or dysarthric.  Facial symmetry is  present.  She has extraocular movements that are full.  She is able to protrude the tongue in the midline with good lateral movement of the tongue.  She has good finger-nose-finger and heel shin bilaterally.  Gait is normal.  Tandem gait is normal.  Romberg is negative.  No drift is seen.  Range of movement the cervical spine is full.  No head and neck tremor was noted.  Assessment and Plan: 1.  Visual distortions, probable migraine headache  2.  History of type 1 diabetes  3.  Side to side head tremor, probable essential tremor  The patient will be set up for MRI of the brain.  She will be placed on Topamax working up to 75 mg at night.  The patient likely is having migrainous events.  She is having daily symptoms at this point, and she still does have some nausea, she takes meclizine for this with some benefit.  She will follow-up here in 2 months.  Follow Up Instructions: 2 month follow up, may see NP    I discussed the assessment and treatment plan with the patient. The patient was provided an opportunity to ask questions and all were answered. The patient agreed with the plan and demonstrated an understanding of the instructions.   The patient was advised to call back or seek an in-person evaluation if the symptoms worsen or if the condition fails to improve as anticipated.  I provided 30 minutes of non-face-to-face time during this encounter.  Kathrynn Ducking, MD

## 2019-04-05 ENCOUNTER — Telehealth: Payer: Self-pay | Admitting: Neurology

## 2019-04-05 NOTE — Telephone Encounter (Signed)
no to the covid-19 questions MR Brain wo contrast Dr. Stephanie Acre Auth: [id number] (exp. 04/01/19 to 09/27/19). Patient is scheduled at [facility] for 04/14/19.

## 2019-04-14 ENCOUNTER — Other Ambulatory Visit: Payer: Self-pay

## 2019-04-14 ENCOUNTER — Ambulatory Visit: Payer: BC Managed Care – PPO

## 2019-04-14 DIAGNOSIS — G4489 Other headache syndrome: Secondary | ICD-10-CM

## 2019-04-16 ENCOUNTER — Telehealth: Payer: Self-pay | Admitting: Neurology

## 2019-04-16 NOTE — Telephone Encounter (Signed)
I called the patient.  MRI of the brain is normal.  Patient likely has migraine phenomenon affecting her vision.    MRI brain 04/15/19:  IMPRESSION:   Normal MRI brain (without).

## 2019-04-23 ENCOUNTER — Telehealth: Payer: BC Managed Care – PPO | Admitting: Neurology

## 2019-06-21 ENCOUNTER — Ambulatory Visit: Payer: BC Managed Care – PPO | Admitting: Neurology

## 2019-07-07 ENCOUNTER — Other Ambulatory Visit: Payer: Self-pay

## 2019-07-07 MED ORDER — TOPIRAMATE 25 MG PO TABS: ORAL_TABLET | ORAL | 0 refills | Status: DC

## 2019-08-23 ENCOUNTER — Ambulatory Visit: Payer: BC Managed Care – PPO | Admitting: Neurology

## 2019-09-27 ENCOUNTER — Other Ambulatory Visit: Payer: Self-pay

## 2019-09-27 ENCOUNTER — Encounter: Payer: Self-pay | Admitting: Neurology

## 2019-09-27 ENCOUNTER — Ambulatory Visit: Payer: BC Managed Care – PPO | Admitting: Neurology

## 2019-09-27 VITALS — BP 118/81 | HR 88 | Temp 97.9°F | Ht 63.0 in | Wt 142.8 lb

## 2019-09-27 DIAGNOSIS — H539 Unspecified visual disturbance: Secondary | ICD-10-CM

## 2019-09-27 MED ORDER — TOPIRAMATE 50 MG PO TABS: ORAL_TABLET | ORAL | 1 refills | Status: DC

## 2019-09-27 MED ORDER — RIZATRIPTAN BENZOATE 10 MG PO TABS: 10.0000 mg | ORAL_TABLET | ORAL | 3 refills | Status: DC | PRN

## 2019-09-27 NOTE — Progress Notes (Signed)
I have read the note, and I agree with the clinical assessment and plan.  Cassandria Drew K Goldie Dimmer   

## 2019-09-27 NOTE — Progress Notes (Signed)
PATIENT: Heather Guerrero DOB: September 01, 1961  REASON FOR VISIT: follow up HISTORY FROM: patient  HISTORY OF PRESENT ILLNESS: Today 09/27/19  [name] is a 58 year old female with a history of type 1 diabetes and headaches.  In June 2020 she started having episodes of visual changes with the sensation that everything was too bright.  She was placed on Topamax, felt to be having migraine events.  MRI of the brain was normal.  She remains on Topamax 75 mg at bedtime.  She says she is no longer having a visual sensations, dizziness that were occurring near daily.  She now complains of every time she is driving or riding in the car, 30 minutes into the trip she will have pressure to the top of her head.  If Drive goes on for an hour or more, she will develop visual distortion, "like I am in a fun house mirror", she will also developed nausea and vomiting.  She says she is miserable until she gets out of the car, also will feel terrible until the next day.  She does not get any relief with vomiting.  She does have Dramamine and Zofran, but is not sure she has taken it with the episodes.  She is planning to go to the beach this weekend which will be a 3+ hour car ride, and she is concerned she will have an episode.  She is no longer taking gabapentin.  She presents evaluation for evaluation unaccompanied.  HISTORY 04/01/2019 Dr. Jannifer Franklin: Heather Guerrero is a 58 year old left-handed white female with a history of type 1 diabetes.  The patient does give a history of a few migraine type headaches when she was a teenager, she generally has not had much problems with headaches as an adult.  Beginning on 20 March 2019, the patient began having onset of episodes of visual changes with the sensation that everything is too bright, colors appear to be brighter than usual and almost seemed to go as if they are neon.  The visual changes may last 2 to 3 hours, but tend to occur on a daily basis at this point.  The patient  also reports a pressure sensation in the head associated with some nausea and vomiting.  The patient has gone on gabapentin, taking 300 mg 3 times daily, this has helped some but the patient continues to have issues on a daily basis.  The patient reports no focal numbness or weakness of the face, arms, or legs.  She denies any double vision.  She reports no changes in balance or difficulty controlling the bowels and bladder, she reports no neck discomfort.  She has also noted a side to side head tremor that has been present off and on over the last several months, when she is cold the head tremor is more prominent.  The patient comes here for further evaluation.  REVIEW OF SYSTEMS: Out of a complete 14 system review of symptoms, the patient complains only of the following symptoms, and all other reviewed systems are negative.  Headache   ALLERGIES: Allergies  Allergen Reactions  . Sulfa Antibiotics     HOME MEDICATIONS: Outpatient Medications Prior to Visit  Medication Sig Dispense Refill  . atorvastatin (LIPITOR) 10 MG tablet Take 10 mg by mouth daily.    . busPIRone (BUSPAR) 5 MG tablet Take 5 mg by mouth daily.    . Cholecalciferol (VITAMIN D3) 1000 units CAPS Take 1,000 Units by mouth daily.    Marland Kitchen gabapentin (NEURONTIN) 300  MG capsule Take 1 capsule (300 mg total) by mouth 3 (three) times daily. 90 capsule 3  . insulin aspart (NOVOLOG) 100 UNIT/ML injection Inject into the skin once. Around 25 units per Pump    . levothyroxine (SYNTHROID, LEVOTHROID) 50 MCG tablet Take 50 mcg by mouth daily before breakfast.    . lisinopril (PRINIVIL,ZESTRIL) 2.5 MG tablet Take 2.5 mg by mouth daily.    . meloxicam (MOBIC) 15 MG tablet Take 15 mg by mouth daily.    . Multiple Vitamin (MULTIVITAMIN) tablet Take 1 tablet by mouth daily.    . ondansetron (ZOFRAN) 4 MG tablet Take 4 mg by mouth as needed.    Marland Kitchen rOPINIRole (REQUIP) 2 MG tablet Take 2 mg by mouth at bedtime.    . sertraline (ZOLOFT) 100 MG  tablet Take 100 mg by mouth daily.    Marland Kitchen topiramate (TOPAMAX) 25 MG tablet 3 tablets at night. 270 tablet 0   No facility-administered medications prior to visit.    PAST MEDICAL HISTORY: Past Medical History:  Diagnosis Date  . Anxiety   . Depression   . Diabetes mellitus without complication ([place])   . Hypercholesteremia   . Hypothyroidism   . 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 Ovarian teratoma   . Restless leg     PAST SURGICAL HISTORY: Past Surgical History:  Procedure Laterality Date  . KNEE ARTHROSCOPY     right  . OVARIAN CYST SURGERY    . SALPINGOOPHORECTOMY      FAMILY HISTORY: Family History  Problem Relation Age of Onset  . Rheum arthritis Mother   . Thyroid cancer Mother   . Diabetes type I Father     SOCIAL HISTORY: Social History   Socioeconomic History  . Marital status: Married    Spouse name: Not on file  . Number of children: Not on file  . Years of education: Not on file  . Highest education level: Not on file  Occupational History  . Not on file  Tobacco Use  . Smoking status: Never Smoker  . Smokeless tobacco: Never Used  Substance and Sexual Activity  . Alcohol use: No  . Drug use: No  . Sexual activity: Not on file  Other Topics Concern  . Not on file  Social History Narrative   Left handed    Caffeine 2 cups per day    Social Determinants of Health   Financial Resource Strain:   . Difficulty of Paying Living Expenses: Not on file  Food Insecurity:   . Worried About Charity fundraiser in the Last Year: Not on file  . Ran Out of Food in the Last Year: Not on file  Transportation Needs:   . Lack of Transportation (Medical): Not on file  . Lack of Transportation (Non-Medical): Not on file  Physical Activity:   . Days of Exercise per Week: Not on file  . Minutes of Exercise per Session: Not on file  Stress:   . Feeling of Stress : Not on file  Social Connections:   . Frequency of Communication with Friends and Family:  Not on file  . Frequency of Social Gatherings with Friends and Family: Not on file  . Attends Religious Services: Not on file  . Active Member of Clubs or Organizations: Not on file  . Attends Archivist Meetings: Not on file  . Marital Status: Not on file  Intimate Partner Violence:   . Fear of Current or Ex-Partner: Not on file  .  Emotionally Abused: Not on file  . Physically Abused: Not on file  . Sexually Abused: Not on file   PHYSICAL EXAM  Vitals:   09/27/19 1020  BP: 118/81  Pulse: 88  Temp: 97.9 F (36.6 C)  TempSrc: Oral  Weight: 142 lb 12.8 oz (64.8 kg)  Height: 5\' 3"  (1.6 m)   Body mass index is 25.3 kg/m.  Generalized: Well developed, in no acute distress   Neurological examination  Mentation: Alert oriented to time, place, history taking. Follows all commands speech and language fluent Cranial nerve II-XII: Pupils were equal round reactive to light. Extraocular movements were full, visual field were full on confrontational test. Facial sensation and strength were normal.  Head turning and shoulder shrug  were normal and symmetric. Motor: The motor testing reveals 5 over 5 strength of all 4 extremities. Good symmetric motor tone is noted throughout.  Sensory: Sensory testing is intact to soft touch on all 4 extremities. No evidence of extinction is noted.  Coordination: Cerebellar testing reveals good finger-nose-finger and heel-to-shin bilaterally.  Gait and station: Gait is normal. Tandem gait is normal. Romberg is negative. No drift is seen.  Reflexes: Deep tendon reflexes are symmetric and normal bilaterally.   DIAGNOSTIC DATA (LABS, IMAGING, TESTING) - I reviewed patient records, labs, notes, testing and imaging myself where available.  Lab Results  Component Value Date   WBC 8.3 03/06/2017   HGB 13.9 03/06/2017   HCT 41.9 03/06/2017   MCV 90.5 03/06/2017   PLT 232 03/06/2017      Component Value Date/Time   NA 139 03/06/2017 1037   K 3.3  (L) 03/06/2017 1037   CL 100 (L) 03/06/2017 1037   CO2 26 03/06/2017 1037   GLUCOSE 169 (H) 03/06/2017 1037   BUN 15 03/06/2017 1037   CREATININE 0.88 03/06/2017 1037   CALCIUM 9.5 03/06/2017 1037   PROT 7.1 03/06/2017 1037   ALBUMIN 4.3 03/06/2017 1037   AST 24 03/06/2017 1037   ALT 25 03/06/2017 1037   ALKPHOS 57 03/06/2017 1037   BILITOT 0.5 03/06/2017 1037   GFRNONAA >60 03/06/2017 1037   GFRAA >60 03/06/2017 1037   No results found for: CHOL, HDL, LDLCALC, LDLDIRECT, TRIG, CHOLHDL No results found for: HGBA1C No results found for: VITAMINB12 No results found for: TSH  ASSESSMENT AND PLAN 58 y.o. year old female  has a past medical history of Anxiety, Depression, Diabetes mellitus without complication ([place]), Hypercholesteremia, Hypothyroidism, Oculomotor nerve palsy, left (03/11/2017), Ovarian teratoma, and Restless leg. here with:  1.  Probable migraine headache 2.  History of type 1 diabetes  She reports episodes that are triggered by motion of driving, after 30 minutes pressure to the top of her head, 1 hour later if still driving will have visual disturbance, nausea, vomiting, feeling miserable.  This continues to sound like a migraine syndrome.  MRI of the brain was normal. I will increase her dose of Topamax to 100 mg at bedtime.  I will give her a trial of Maxalt 10 mg to take at the onset of her episode of pressure to the top of head that progresses into nausea, vomiting, triggered by motion of riding in the car.  She will follow-up in 6 months or sooner if needed.  I did advise if her symptoms worsen or she develops any new symptoms she should let us know. If needed in the future, I may try higher dose of Topamax, or resume gabapentin or possibly try nortriptyline.  I spent  15 minutes with the patient. 50% of this time was spent discussing her plan of care.    Butler Denmark, AGNP-C, DNP 09/27/2019, 10:54 AM [facility] [address] [place], [place] [address] [phone]

## 2019-09-27 NOTE — Patient Instructions (Signed)
I will discuss with Dr. Jannifer Franklin our plan going forward, I will contact you today to discuss next steps

## 2020-03-28 ENCOUNTER — Ambulatory Visit: Payer: BC Managed Care – PPO | Admitting: Neurology

## 2020-03-28 ENCOUNTER — Other Ambulatory Visit: Payer: Self-pay

## 2020-03-28 ENCOUNTER — Encounter: Payer: Self-pay | Admitting: Neurology

## 2020-03-28 VITALS — BP 107/71 | HR 83 | Ht 63.0 in | Wt 140.0 lb

## 2020-03-28 DIAGNOSIS — H539 Unspecified visual disturbance: Secondary | ICD-10-CM | POA: Diagnosis not present

## 2020-03-28 DIAGNOSIS — G4489 Other headache syndrome: Secondary | ICD-10-CM

## 2020-03-28 MED ORDER — TOPIRAMATE 100 MG PO TABS: 100.0000 mg | ORAL_TABLET | Freq: Every day | ORAL | 4 refills | Status: DC

## 2020-03-28 NOTE — Progress Notes (Signed)
I have read the note, and I agree with the clinical assessment and plan.  Lilymarie Scroggins K Kimya Mccahill   

## 2020-03-28 NOTE — Patient Instructions (Signed)
Continue Topamax 100 mg at bedtime You may take Maxalt as needed for acute headache  See you back in 1 year or sooner if needed

## 2020-03-28 NOTE — Progress Notes (Signed)
PATIENT: Heather Guerrero DOB: 31-Aug-1961  REASON FOR VISIT: follow up HISTORY FROM: patient  HISTORY OF PRESENT ILLNESS: Today 03/28/20  Heather Guerrero is a 59 year old female with history of type 1 diabetes and headaches.  In June 2020, she had episodes of visual changes with a sensation that everything was too bright.  Felt to be having migraine events, was placed on Topamax.  MRI of the brain was normal. At last visit, she was having significant episodes triggered by motion of driving.  Topamax was increased to 100 mg at bedtime.  She has no longer had any of the headache or visual sensations.  She is doing well at current dose of Topamax.  She has not had to take Maxalt.  Presents today for evaluation unaccompanied.  HISTORY  09/27/2019 SS: Heather Guerrero is a 59 year old female with a history of type 1 diabetes and headaches.  In June 2020 she started having episodes of visual changes with the sensation that everything was too bright.  She was placed on Topamax, felt to be having migraine events.  MRI of the brain was normal.  She remains on Topamax 75 mg at bedtime.  She says she is no longer having a visual sensations, dizziness that were occurring near daily.  She now complains of every time she is driving or riding in the car, 30 minutes into the trip she will have pressure to the top of her head.  If Drive goes on for an hour or more, she will develop visual distortion, "like I am in a fun house mirror", she will also developed nausea and vomiting.  She says she is miserable until she gets out of the car, also will feel terrible until the next day.  She does not get any relief with vomiting.  She does have Dramamine and Zofran, but is not sure she has taken it with the episodes.  She is planning to go to the beach this weekend which will be a 3+ hour car ride, and she is concerned she will have an episode.  She is no longer taking gabapentin.  She presents evaluation for evaluation  unaccompanied.  REVIEW OF SYSTEMS: Out of a complete 14 system review of symptoms, the patient complains only of the following symptoms, and all other reviewed systems are negative.  Headache  ALLERGIES: Allergies  Allergen Reactions  . Sulfa Antibiotics     HOME MEDICATIONS: Outpatient Medications Prior to Visit  Medication Sig Dispense Refill  . atorvastatin (LIPITOR) 10 MG tablet Take 10 mg by mouth daily.    Marland Kitchen buPROPion (WELLBUTRIN SR) 100 MG 12 hr tablet Take 100 mg by mouth 2 (two) times daily.    . busPIRone (BUSPAR) 5 MG tablet Take 5 mg by mouth daily.    . celecoxib (CELEBREX) 200 MG capsule Take 200 mg by mouth 2 (two) times daily.    . Cholecalciferol (VITAMIN D3) 1000 units CAPS Take 1,000 Units by mouth daily.    . insulin aspart (NOVOLOG) 100 UNIT/ML injection Inject into the skin once. Around 25 units per Pump    . levothyroxine (SYNTHROID, LEVOTHROID) 50 MCG tablet Take 50 mcg by mouth daily before breakfast.    . lisinopril (PRINIVIL,ZESTRIL) 2.5 MG tablet Take 2.5 mg by mouth daily.    . Multiple Vitamin (MULTIVITAMIN) tablet Take 1 tablet by mouth daily.    . ondansetron (ZOFRAN) 4 MG tablet Take 4 mg by mouth as needed.    . pantoprazole (PROTONIX) 40 MG tablet Take  40 mg by mouth daily.    . rizatriptan (MAXALT) 10 MG tablet Take 1 tablet (10 mg total) by mouth as needed for migraine. May repeat in 2 hours if needed 10 tablet 3  . rOPINIRole (REQUIP) 2 MG tablet Take 2 mg by mouth at bedtime.    . sertraline (ZOLOFT) 100 MG tablet Take 100 mg by mouth daily.    Marland Kitchen topiramate (TOPAMAX) 50 MG tablet Take 2 at bedtime 180 tablet 1  . gabapentin (NEURONTIN) 300 MG capsule Take 1 capsule (300 mg total) by mouth 3 (three) times daily. 90 capsule 3  . meloxicam (MOBIC) 15 MG tablet Take 15 mg by mouth daily.     No facility-administered medications prior to visit.    PAST MEDICAL HISTORY: Past Medical History:  Diagnosis Date  . Anxiety   . Depression   .  Diabetes mellitus without complication ([place])   . Hypercholesteremia   . Hypothyroidism   . 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 Ovarian teratoma   . Restless leg     PAST SURGICAL HISTORY: Past Surgical History:  Procedure Laterality Date  . KNEE ARTHROSCOPY     right  . OVARIAN CYST SURGERY    . SALPINGOOPHORECTOMY      FAMILY HISTORY: Family History  Problem Relation Age of Onset  . Rheum arthritis Mother   . Thyroid cancer Mother   . Diabetes type I Father     SOCIAL HISTORY: Social History   Socioeconomic History  . Marital status: Married    Spouse name: Not on file  . Number of children: Not on file  . Years of education: Not on file  . Highest education level: Not on file  Occupational History  . Not on file  Tobacco Use  . Smoking status: Never Smoker  . Smokeless tobacco: Never Used  Vaping Use  . Vaping Use: Never used  Substance and Sexual Activity  . Alcohol use: No  . Drug use: No  . Sexual activity: Not on file  Other Topics Concern  . Not on file  Social History Narrative   Left handed    Caffeine 2 cups per day    Social Determinants of Health   Financial Resource Strain:   . Difficulty of Paying Living Expenses:   Food Insecurity:   . Worried About Charity fundraiser in the Last Year:   . Arboriculturist in the Last Year:   Transportation Needs:   . Film/video editor (Medical):   Marland Kitchen Lack of Transportation (Non-Medical):   Physical Activity:   . Days of Exercise per Week:   . Minutes of Exercise per Session:   Stress:   . Feeling of Stress :   Social Connections:   . Frequency of Communication with Friends and Family:   . Frequency of Social Gatherings with Friends and Family:   . Attends Religious Services:   . Active Member of Clubs or Organizations:   . Attends Archivist Meetings:   Marland Kitchen Marital Status:   Intimate Partner Violence:   . Fear of Current or Ex-Partner:   . Emotionally Abused:   Marland Kitchen Physically  Abused:   . Sexually Abused:    PHYSICAL EXAM  Vitals:   03/28/20 1435  BP: 107/71  Pulse: 83  Weight: 140 lb (63.5 kg)  Height: 5\' 3"  (1.6 m)   Body mass index is 24.8 kg/m.  Generalized: Well developed, in no acute distress   Neurological examination  Mentation: Alert oriented to time, place, history taking. Follows all commands speech and language fluent Cranial nerve II-XII: Pupils were equal round reactive to light. Extraocular movements were full, visual field were full on confrontational test. Facial sensation and strength were normal.  Head turning and shoulder shrug  were normal and symmetric. Motor: The motor testing reveals 5 over 5 strength of all 4 extremities. Good symmetric motor tone is noted throughout.  Sensory: Sensory testing is intact to soft touch on all 4 extremities. No evidence of extinction is noted.  Coordination: Cerebellar testing reveals good finger-nose-finger and heel-to-shin bilaterally.  Gait and station: Gait is normal. Reflexes: Deep tendon reflexes are symmetric and normal bilaterally.   DIAGNOSTIC DATA (LABS, IMAGING, TESTING) - I reviewed patient records, labs, notes, testing and imaging myself where available.  Lab Results  Component Value Date   WBC 8.3 03/06/2017   HGB 13.9 03/06/2017   HCT 41.9 03/06/2017   MCV 90.5 03/06/2017   PLT 232 03/06/2017      Component Value Date/Time   NA 139 03/06/2017 1037   K 3.3 (L) 03/06/2017 1037   CL 100 (L) 03/06/2017 1037   CO2 26 03/06/2017 1037   GLUCOSE 169 (H) 03/06/2017 1037   BUN 15 03/06/2017 1037   CREATININE 0.88 03/06/2017 1037   CALCIUM 9.5 03/06/2017 1037   PROT 7.1 03/06/2017 1037   ALBUMIN 4.3 03/06/2017 1037   AST 24 03/06/2017 1037   ALT 25 03/06/2017 1037   ALKPHOS 57 03/06/2017 1037   BILITOT 0.5 03/06/2017 1037   GFRNONAA >60 03/06/2017 1037   GFRAA >60 03/06/2017 1037   No results found for: CHOL, HDL, LDLCALC, LDLDIRECT, TRIG, CHOLHDL No results found for:  HGBA1C No results found for: VITAMINB12 No results found for: TSH    ASSESSMENT AND PLAN 59 y.o. year old female  has a past medical history of Anxiety, Depression, Diabetes mellitus without complication ([place]), Hypercholesteremia, Hypothyroidism, Oculomotor nerve palsy, left (03/11/2017), Ovarian teratoma, and Restless leg. here with:  1.  Probable migraine headache  She has had excellent improvement in her symptoms with Topamax.  She will remain on Topamax 100 mg at bedtime.  She will keep Maxalt on hand to take if needed.  She will follow-up in 1 year or sooner if needed.  I spent 20 minutes of face-to-face and non-face-to-face time with patient.  This included previsit chart review, lab review, study review, order entry, electronic health record documentation, patient education.  Butler Denmark, AGNP-C, DNP 03/28/2020, 2:57 PM [facility] [address], [place], [place] [address] [phone]

## 2021-04-03 ENCOUNTER — Ambulatory Visit: Payer: BC Managed Care – PPO | Admitting: Neurology

## 2021-04-04 ENCOUNTER — Ambulatory Visit: Payer: BC Managed Care – PPO | Admitting: Neurology

## 2021-04-04 ENCOUNTER — Encounter: Payer: Self-pay | Admitting: Neurology

## 2021-04-04 VITALS — BP 110/73 | HR 86 | Ht 63.0 in | Wt 122.0 lb

## 2021-04-04 DIAGNOSIS — G4489 Other headache syndrome: Secondary | ICD-10-CM | POA: Diagnosis not present

## 2021-04-04 MED ORDER — RIZATRIPTAN BENZOATE 10 MG PO TABS: 10.0000 mg | ORAL_TABLET | ORAL | 3 refills | Status: DC | PRN

## 2021-04-04 MED ORDER — TOPIRAMATE 100 MG PO TABS: 100.0000 mg | ORAL_TABLET | Freq: Every day | ORAL | 4 refills | Status: DC

## 2021-04-04 NOTE — Patient Instructions (Signed)
Continue Topamax at current dosing Call for any spells See you back in 1 year

## 2021-04-04 NOTE — Progress Notes (Signed)
I have read the note, and I agree with the clinical assessment and plan.  Raeann Offner K Suella Cogar   

## 2021-04-04 NOTE — Progress Notes (Signed)
PATIENT: Heather Guerrero DOB: [birth year]  REASON FOR VISIT: follow up HISTORY FROM: patient Primary neurologist: Dr. Jannifer Franklin  HISTORY OF PRESENT ILLNESS: Today 04/04/21  Heather Guerrero is a 60 year old female with history of type 1 diabetes and headaches.  Doing well on Topamax.  Denies any headache or visual sensations.  At 1 time, was having episodes triggered by motion of driving.  None have occurred since Topamax was increased to 100 mg at bedtime.  Denies any side effects.  Has not had to take Maxalt.  She is going to the beach in August, the 3 hour drive will be a test for her.  Her diabetes is doing well, sees endocrinology.  MRI of the brain has been normal.  At times, he may have dizziness with standing, has had some ear issues.  She works as a Financial trader.  Here today for evaluation unaccompanied.  Update 03/28/20 SS: Heather Guerrero is a 60 year old female with history of type 1 diabetes and headaches.  In June 2020, she had episodes of visual changes with a sensation that everything was too bright.  Felt to be having migraine events, was placed on Topamax.  MRI of the brain was normal. At last visit, she was having significant episodes triggered by motion of driving.  Topamax was increased to 100 mg at bedtime.  She has no longer had any of the headache or visual sensations.  She is doing well at current dose of Topamax.  She has not had to take Maxalt.  Presents today for evaluation unaccompanied.  HISTORY  09/27/2019 SS: Heather Guerrero is a 60 year old female with a history of type 1 diabetes and headaches.  In June 2020 she started having episodes of visual changes with the sensation that everything was too bright.  She was placed on Topamax, felt to be having migraine events.  MRI of the brain was normal.  She remains on Topamax 75 mg at bedtime.  She says she is no longer having a visual sensations, dizziness that were occurring near daily.  She now complains of every time she  is driving or riding in the car, 30 minutes into the trip she will have pressure to the top of her head.  If Drive goes on for an hour or more, she will develop visual distortion, "like I am in a fun house mirror", she will also developed nausea and vomiting.  She says she is miserable until she gets out of the car, also will feel terrible until the next day.  She does not get any relief with vomiting.  She does have Dramamine and Zofran, but is not sure she has taken it with the episodes.  She is planning to go to the beach this weekend which will be a 3+ hour car ride, and she is concerned she will have an episode.  She is no longer taking gabapentin.  She presents evaluation for evaluation unaccompanied.  REVIEW OF SYSTEMS: Out of a complete 14 system review of symptoms, the patient complains only of the following symptoms, and all other reviewed systems are negative.  See HPI  ALLERGIES: Allergies  Allergen Reactions   Sulfa Antibiotics     HOME MEDICATIONS: Outpatient Medications Prior to Visit  Medication Sig Dispense Refill   atorvastatin (LIPITOR) 10 MG tablet Take 10 mg by mouth daily.     buPROPion (WELLBUTRIN SR) 100 MG 12 hr tablet Take 100 mg by mouth 2 (two) times daily.     busPIRone (BUSPAR) 5  MG tablet Take 5 mg by mouth daily.     celecoxib (CELEBREX) 200 MG capsule Take 200 mg by mouth 2 (two) times daily.     Cholecalciferol (VITAMIN D3) 1000 units CAPS Take 1,000 Units by mouth daily.     insulin aspart (NOVOLOG) 100 UNIT/ML injection Inject into the skin once. Around 25 units per Pump     levothyroxine (SYNTHROID, LEVOTHROID) 50 MCG tablet Take 50 mcg by mouth daily before breakfast.     lisinopril (PRINIVIL,ZESTRIL) 2.5 MG tablet Take 2.5 mg by mouth daily.     Multiple Vitamin (MULTIVITAMIN) tablet Take 1 tablet by mouth daily.     ondansetron (ZOFRAN) 4 MG tablet Take 4 mg by mouth as needed.     rOPINIRole (REQUIP) 2 MG tablet Take 2 mg by mouth at bedtime.      sertraline (ZOLOFT) 100 MG tablet Take 100 mg by mouth daily.     rizatriptan (MAXALT) 10 MG tablet Take 1 tablet (10 mg total) by mouth as needed for migraine. May repeat in 2 hours if needed 10 tablet 3   topiramate (TOPAMAX) 100 MG tablet Take 1 tablet (100 mg total) by mouth at bedtime. 90 tablet 4   pantoprazole (PROTONIX) 40 MG tablet Take 40 mg by mouth daily.     No facility-administered medications prior to visit.    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   Ovarian teratoma    Restless leg     PAST SURGICAL HISTORY: Past Surgical History:  Procedure Laterality Date   KNEE ARTHROSCOPY     right   OVARIAN CYST SURGERY     SALPINGOOPHORECTOMY      FAMILY HISTORY: Family History  Problem Relation Age of Onset   Rheum arthritis Mother    Thyroid cancer Mother    Diabetes type I Father     SOCIAL HISTORY: Social History   Socioeconomic History   Marital status: Married    Spouse name: Not on file   Number of children: Not on file   Years of education: Not on file   Highest education level: Not on file  Occupational History   Not on file  Tobacco Use   Smoking status: Never   Smokeless tobacco: Never  Vaping Use   Vaping Use: Never used  Substance and Sexual Activity   Alcohol use: No   Drug use: No   Sexual activity: Not on file  Other Topics Concern   Not on file  Social History Narrative   Left handed    Caffeine 2 cups per day    Social Determinants of Health   Financial Resource Strain: Not on file  Food Insecurity: Not on file  Transportation Needs: Not on file  Physical Activity: Not on file  Stress: Not on file  Social Connections: Not on file  Intimate Partner Violence: Not on file   PHYSICAL EXAM  Vitals:   04/04/21 1019  BP: 110/73  Pulse: 86  Weight:  122 lb (55.3 kg)  Height: 5\' 3"  (1.6 m)    Body mass index is 21.61 kg/m.  Generalized: Well developed, in no acute distress   Neurological examination  Mentation: Alert oriented to time, place, history taking. Follows all commands speech and language fluent Cranial nerve II-XII: Pupils were equal round reactive  to light. Extraocular movements were full, visual field were full on confrontational test. Facial sensation and strength were normal.  Head turning and shoulder shrug  were normal and symmetric. Motor: The motor testing reveals 5 over 5 strength of all 4 extremities. Good symmetric motor tone is noted throughout.  Sensory: Sensory testing is intact to soft touch on all 4 extremities. No evidence of extinction is noted.  Coordination: Cerebellar testing reveals good finger-nose-finger and heel-to-shin bilaterally.  Gait and station: Gait is normal.  Tandem gait is normal.  Romberg is negative. Reflexes: Deep tendon reflexes are symmetric and normal bilaterally.   DIAGNOSTIC DATA (LABS, IMAGING, TESTING) - I reviewed patient records, labs, notes, testing and imaging myself where available.  Lab Results  Component Value Date   WBC 8.3 03/06/2017   HGB 13.9 03/06/2017   HCT 41.9 03/06/2017   MCV 90.5 03/06/2017   PLT 232 03/06/2017      Component Value Date/Time   NA 139 03/06/2017 1037   K 3.3 (L) 03/06/2017 1037   CL 100 (L) 03/06/2017 1037   CO2 26 03/06/2017 1037   GLUCOSE 169 (H) 03/06/2017 1037   BUN 15 03/06/2017 1037   CREATININE 0.88 03/06/2017 1037   CALCIUM 9.5 03/06/2017 1037   PROT 7.1 03/06/2017 1037   ALBUMIN 4.3 03/06/2017 1037   AST 24 03/06/2017 1037   ALT 25 03/06/2017 1037   ALKPHOS 57 03/06/2017 1037   BILITOT 0.5 03/06/2017 1037   GFRNONAA >60 03/06/2017 1037   GFRAA >60 03/06/2017 1037   No results found for: CHOL, HDL, LDLCALC, LDLDIRECT, TRIG, CHOLHDL No results found for: HGBA1C No results found for: VITAMINB12 No results found for:  TSH  ASSESSMENT AND PLAN 60 y.o. year old female  has a past medical history of Anxiety, Depression, Diabetes mellitus without complication ([place]), Hypercholesteremia, Hypothyroidism, Oculomotor nerve palsy, left (03/11/2017), Ovarian teratoma, and Restless leg. here with:  1.  Probable migraine headache  -Episodes are under excellent control, no headache or visual disturbance -Will continue Topamax 100 mg at bedtime -Refill Maxalt to have on hand if needed -Follow-up in 1 year or sooner if needed, if episodes continue to be well controlled, will consider a trial of Topamax dose reduction  Butler Denmark, AGNP-C, DNP 04/04/2021, 10:59 AM [facility] [address], [place], [place] [address] [phone]

## 2022-01-07 ENCOUNTER — Other Ambulatory Visit: Payer: Self-pay

## 2022-01-07 MED ORDER — TOPIRAMATE 100 MG PO TABS: 100.0000 mg | ORAL_TABLET | Freq: Every day | ORAL | 4 refills | Status: DC

## 2022-01-07 NOTE — Progress Notes (Signed)
Rx refilled.

## 2022-04-03 NOTE — Progress Notes (Signed)
PATIENT: Heather Guerrero DOB: [birth year]  REASON FOR VISIT: follow up HISTORY FROM: patient, husband Primary neurologist: Dr. Willis/Dohmeier  HISTORY OF PRESENT ILLNESS: Today 04/04/22  Heather Guerrero is here today for follow-up. Remains on Topamax 100 mg at bedtime.  Has been very helpful to alleviate visual disturbances felt to be migrainous.  Will extend into the next day.  Continues to have carsickness, going on for years, at times can be accompanied with bilateral temple headache. Example: brought on by long car rides, start and stopping in construction zone.  Recently took Maxalt with no benefit was expired. Is please with current condition. Got a new insulin pump.   Update 04/04/21 SS: Heather Guerrero is a 61 year old female with history of type 1 diabetes and headaches.  Doing well on Topamax.  Denies any headache or visual sensations.  At 1 time, was having episodes triggered by motion of driving.  None have occurred since Topamax was increased to 100 mg at bedtime.  Denies any side effects.  Has not had to take Maxalt.  She is going to the beach in August, the 3 hour drive will be a test for her.  Her diabetes is doing well, sees endocrinology.  MRI of the brain has been normal.  At times, he may have dizziness with standing, has had some ear issues.  She works as a Financial trader.  Here today for evaluation unaccompanied.  Update 03/28/20 SS: Heather Guerrero is a 61 year old female with history of type 1 diabetes and headaches.  In June 2020, she had episodes of visual changes with a sensation that everything was too bright.  Felt to be having migraine events, was placed on Topamax.  MRI of the brain was normal. At last visit, she was having significant episodes triggered by motion of driving.  Topamax was increased to 100 mg at bedtime.  She has no longer had any of the headache or visual sensations.  She is doing well at current dose of Topamax.  She has not had to take Maxalt.  Presents  today for evaluation unaccompanied.  REVIEW OF SYSTEMS: Out of a complete 14 system review of symptoms, the patient complains only of the following symptoms, and all other reviewed systems are negative.  See HPI  ALLERGIES: Allergies  Allergen Reactions   Sulfa Antibiotics     HOME MEDICATIONS: Outpatient Medications Prior to Visit  Medication Sig Dispense Refill   atorvastatin (LIPITOR) 10 MG tablet Take 10 mg by mouth daily.     buPROPion (WELLBUTRIN SR) 100 MG 12 hr tablet Take 100 mg by mouth 2 (two) times daily.     busPIRone (BUSPAR) 5 MG tablet Take 5 mg by mouth daily.     celecoxib (CELEBREX) 200 MG capsule Take 200 mg by mouth 2 (two) times daily.     Cholecalciferol (VITAMIN D3) 1000 units CAPS Take 1,000 Units by mouth daily.     insulin aspart (NOVOLOG) 100 UNIT/ML injection Inject into the skin once. Around 25 units per Pump     levothyroxine (SYNTHROID, LEVOTHROID) 50 MCG tablet Take 50 mcg by mouth daily before breakfast.     lisinopril (PRINIVIL,ZESTRIL) 2.5 MG tablet Take 2.5 mg by mouth daily.     Multiple Vitamin (MULTIVITAMIN) tablet Take 1 tablet by mouth daily.     ondansetron (ZOFRAN) 4 MG tablet Take 4 mg by mouth as needed.     rizatriptan (MAXALT) 10 MG tablet Take 1 tablet (10 mg total) by mouth as needed for  migraine. May repeat in 2 hours if needed 10 tablet 3   rOPINIRole (REQUIP) 2 MG tablet Take 2 mg by mouth at bedtime.     sertraline (ZOLOFT) 100 MG tablet Take 100 mg by mouth daily.     topiramate (TOPAMAX) 100 MG tablet Take 1 tablet (100 mg total) by mouth at bedtime. 90 tablet 4   No facility-administered medications prior to visit.    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   Ovarian teratoma    Restless leg     PAST SURGICAL HISTORY: Past Surgical History:   Procedure Laterality Date   KNEE ARTHROSCOPY     right   OVARIAN CYST SURGERY     SALPINGOOPHORECTOMY      FAMILY HISTORY: Family History  Problem Relation Age of Onset   Rheum arthritis Mother    Thyroid cancer Mother    Diabetes type I Father     SOCIAL HISTORY: Social History   Socioeconomic History   Marital status: Married    Spouse name: Not on file   Number of children: Not on file   Years of education: Not on file   Highest education level: Not on file  Occupational History   Not on file  Tobacco Use   Smoking status: Never   Smokeless tobacco: Never  Vaping Use   Vaping Use: Never used  Substance and Sexual Activity   Alcohol use: No   Drug use: No   Sexual activity: Not on file  Other Topics Concern   Not on file  Social History Narrative   Left handed    Caffeine 2 cups per day    Social Determinants of Health   Financial Resource Strain: Not on file  Food Insecurity: Not on file  Transportation Needs: Not on file  Physical Activity: Not on file  Stress: Not on file  Social Connections: Not on file  Intimate Partner Violence: Not on file   PHYSICAL EXAM  Vitals:   04/04/22 1427  Weight: 120 lb 8 oz (54.7 kg)  Height: '5\' 3"'$  (1.6 m)   Body mass index is 21.35 kg/m.  Generalized: Well developed, in no acute distress   Neurological examination  Mentation: Alert oriented to time, place, history taking. Follows all commands speech and language fluent Cranial nerve II-XII: Pupils were equal round reactive to light. Extraocular movements were full, visual field were full on confrontational test. Facial sensation and strength were normal.  Head turning and shoulder shrug  were normal and symmetric. Motor: The motor testing reveals 5 over 5 strength of all 4 extremities. Good symmetric motor tone is noted throughout.  Sensory: Sensory testing is intact to soft touch on all 4 extremities. No evidence of extinction is noted.  Coordination:  Cerebellar testing reveals good finger-nose-finger and heel-to-shin bilaterally.  Gait and station: Gait is normal.  Tandem gait is normal.   Reflexes: Deep tendon reflexes are symmetric and normal bilaterally.   DIAGNOSTIC DATA (LABS, IMAGING, TESTING) - I reviewed patient records, labs, notes, testing and imaging myself where available.  Lab Results  Component Value Date   WBC 8.3 03/06/2017   HGB 13.9 03/06/2017   HCT 41.9 03/06/2017   MCV 90.5 03/06/2017   PLT 232 03/06/2017      Component Value Date/Time   NA  139 03/06/2017 1037   K 3.3 (L) 03/06/2017 1037   CL 100 (L) 03/06/2017 1037   CO2 26 03/06/2017 1037   GLUCOSE 169 (H) 03/06/2017 1037   BUN 15 03/06/2017 1037   CREATININE 0.88 03/06/2017 1037   CALCIUM 9.5 03/06/2017 1037   PROT 7.1 03/06/2017 1037   ALBUMIN 4.3 03/06/2017 1037   AST 24 03/06/2017 1037   ALT 25 03/06/2017 1037   ALKPHOS 57 03/06/2017 1037   BILITOT 0.5 03/06/2017 1037   GFRNONAA >60 03/06/2017 1037   GFRAA >60 03/06/2017 1037   No results found for: "CHOL", "HDL", "LDLCALC", "LDLDIRECT", "TRIG", "CHOLHDL" No results found for: "HGBA1C" No results found for: "VITAMINB12" No results found for: "TSH"  ASSESSMENT AND PLAN 61 y.o. year old female   1.  Probable migraine headache -Much improvement with Topamax, continue 100 mg at bedtime as migraine preventative -Continue Maxalt as needed for acute treatment -Discussed trial of other medication for spells of dizziness with driving often associated with headache, already taking BuSpar, Wellbutrin, Zoloft; CGRP may be reasonable trial if interested in future -Follow-up in 1 year virtually  Butler Denmark, Laqueta Jean, DNP 04/04/2022, 2:35 PM [facility] [address], [place], [place] [address] [phone]

## 2022-04-04 ENCOUNTER — Ambulatory Visit: Payer: BC Managed Care – PPO | Admitting: Neurology

## 2022-04-04 VITALS — BP 94/68 | HR 91 | Ht 63.0 in | Wt 120.5 lb

## 2022-04-04 DIAGNOSIS — G4489 Other headache syndrome: Secondary | ICD-10-CM

## 2022-04-04 MED ORDER — TOPIRAMATE 100 MG PO TABS: 100.0000 mg | ORAL_TABLET | Freq: Every day | ORAL | 4 refills | Status: DC

## 2022-04-04 MED ORDER — RIZATRIPTAN BENZOATE 10 MG PO TABS
10.0000 mg | ORAL_TABLET | ORAL | 5 refills | Status: DC | PRN
Start: 2022-04-04 — End: 2023-04-02

## 2023-03-20 ENCOUNTER — Other Ambulatory Visit: Payer: Self-pay | Admitting: Neurology

## 2023-04-02 ENCOUNTER — Telehealth: Payer: BC Managed Care – PPO | Admitting: Neurology

## 2023-04-02 DIAGNOSIS — H539 Unspecified visual disturbance: Secondary | ICD-10-CM

## 2023-04-02 DIAGNOSIS — G4489 Other headache syndrome: Secondary | ICD-10-CM | POA: Diagnosis not present

## 2023-04-02 MED ORDER — RIZATRIPTAN BENZOATE 10 MG PO TABS: 10.0000 mg | ORAL_TABLET | ORAL | 5 refills | Status: AC | PRN

## 2023-04-02 NOTE — Patient Instructions (Signed)
Great to see you today! Try and reduce Topamax to 50 mg at bedtime, can increase if headaches return.  Keep me posted when you to refill Topamax.  See you in 1 year.  Thanks!!

## 2023-04-02 NOTE — Progress Notes (Signed)
   Virtual Visit via Video Note  I connected with Heather Guerrero on 04/02/23 at  2:45 PM EDT by a video enabled telemedicine application and verified that I am speaking with the correct person using two identifiers.  Location: Patient: at her job Provider: in the office    I discussed the limitations of evaluation and management by telemedicine and the availability of in person appointments. The patient expressed understanding and agreed to proceed.  History of Present Illness: 04/02/23 SS: Via VV. Remains on Topamax. No longer has visual disturbances with the Topamax. Still has the carsickness, staying about the same. Has not needed the Maxalt. If going to be long car ride will take dramamine, in the past this has triggered visual disturbances.  Would like to discuss reducing Topamax.   04/04/22 SS: Heather Asp is here today for follow-up. Remains on Topamax 100 mg at bedtime.  Has been very helpful to alleviate visual disturbances felt to be migrainous.  Will extend into the next day.  Continues to have carsickness, going on for years, at times can be accompanied with bilateral temple headache. Example: brought on by long car rides, start and stopping in construction zone.  Recently took Maxalt with no benefit was expired. Is please with current condition. Got a new insulin pump.    Observations/Objective: Via video visit, is alert and oriented, speech is clear and concise, facial symmetry noted, follows commands, moves about freely  Assessment and Plan: 1.  Probable migraine headache -Significant improvement with Topamax, will try to reduce from 100 mg at bedtime to 50 mg at bedtime, can increase if headaches return, she is going to let me know when she needs a new prescription, will send in the 50 mg tablet -Can continue Maxalt as needed for acute treatment -Follow-up in 1 year virtually or sooner if needed  Follow Up Instructions: 1 year   I discussed the assessment and treatment plan with  the patient. The patient was provided an opportunity to ask questions and all were answered. The patient agreed with the plan and demonstrated an understanding of the instructions.   The patient was advised to call back or seek an in-person evaluation if the symptoms worsen or if the condition fails to improve as anticipated.  Otila Kluver, DNP  [facility] [address] [place], [place] [address] [phone]

## 2024-03-07 ENCOUNTER — Other Ambulatory Visit: Payer: Self-pay | Admitting: Neurology

## 2024-04-07 ENCOUNTER — Telehealth: Payer: BC Managed Care – PPO | Admitting: Neurology

## 2024-04-07 DIAGNOSIS — G4489 Other headache syndrome: Secondary | ICD-10-CM

## 2024-04-07 MED ORDER — TOPIRAMATE 100 MG PO TABS: 100.0000 mg | ORAL_TABLET | Freq: Every day | ORAL | 3 refills | Status: AC

## 2024-04-07 NOTE — Patient Instructions (Signed)
 Great to see you today.  Please continue Topamax .  Please call for any new issues or concerns.  Follow-up in 1 year.  Thanks!!

## 2024-04-07 NOTE — Progress Notes (Signed)
   Virtual Visit via Video Note  I connected with Heather Guerrero on 04/07/24 at  2:30 PM EDT by a video enabled telemedicine application and verified that I am speaking with the correct person using two identifiers.  Location: Patient: at her job Provider: in the office    I discussed the limitations of evaluation and management by telemedicine and the availability of in person appointments. The patient expressed understanding and agreed to proceed.  History of Present Illness: 04/07/24 SS: Starting a new job, as Psychologist, counselling.  Excited, hoping for less stress with new role. Last visit we tried to reduce her Topamax  from 100 to 50, when she tried to reduce when she was riding in the car, had return of nausea, carsickness. Remains on Topamax  100 mg daily. Does still get carsick but not with weird headache. Has not needed the Maxalt . Will take dramamine if it is rainy,cloudy for long car ride or out of town appointment. Recent CMP was fine.   04/02/23 SS: Via VV. Remains on Topamax . No longer has visual disturbances with the Topamax . Still has the carsickness, staying about the same. Has not needed the Maxalt . If going to be long car ride will take dramamine, in the past this has triggered visual disturbances.  Would like to discuss reducing Topamax .   04/04/22 SS: Heather Guerrero is here today for follow-up. Remains on Topamax  100 mg at bedtime.  Has been very helpful to alleviate visual disturbances felt to be migrainous.  Will extend into the next day.  Continues to have carsickness, going on for years, at times can be accompanied with bilateral temple headache. Example: brought on by long car rides, start and stopping in construction zone.  Recently took Maxalt  with no benefit was expired. Is please with current condition. Got a new insulin pump.    Observations/Objective: Via video visit, is alert and oriented, speech is clear and concise, facial symmetry noted, follows commands, moves about  freely  Assessment and Plan: 1.  Probable migraine headache - Tried to reduce Topamax  to 50 mg, had return of carsickness, headache - Continue Topamax  100 mg at bedtime for headache syndrome prevention - Can continue Maxalt  as needed, but has not needed in the last year - Mentions over the last year or so some concern for memory issues, however has been under significant stress with her job.  She is hoping when she starts a new job things will improve.  She is planning to discuss with her primary care doctor.  Did discuss we could again try to reduce Topamax  to 75 mg at bedtime this time as Topamax  can cause brain fog.  Follow Up Instructions: 1 year   I discussed the assessment and treatment plan with the patient. The patient was provided an opportunity to ask questions and all were answered. The patient agreed with the plan and demonstrated an understanding of the instructions.   The patient was advised to call back or seek an in-person evaluation if the symptoms worsen or if the condition fails to improve as anticipated.  Lauraine Gayland MANDES, DNP  [facility] [address], [address] [place], [place] [address] [phone]

## 2025-04-13 ENCOUNTER — Telehealth: Admitting: Neurology
